# Patient Record
Sex: Male | Born: 1960
Health system: Southern US, Community
[De-identification: ages and names within clinical notes are randomized; demographics above are authoritative.]

## PROBLEM LIST (undated history)

## (undated) DIAGNOSIS — J189 Pneumonia, unspecified organism: Secondary | ICD-10-CM

## (undated) DIAGNOSIS — I10 Essential (primary) hypertension: Secondary | ICD-10-CM

## (undated) DIAGNOSIS — I739 Peripheral vascular disease, unspecified: Secondary | ICD-10-CM

## (undated) DIAGNOSIS — I2510 Atherosclerotic heart disease of native coronary artery without angina pectoris: Secondary | ICD-10-CM

## (undated) DIAGNOSIS — E785 Hyperlipidemia, unspecified: Secondary | ICD-10-CM

## (undated) HISTORY — PX: TONSILLECTOMY: SUR1361

## (undated) HISTORY — DX: Hyperlipidemia, unspecified: E78.5

## (undated) HISTORY — DX: Peripheral vascular disease, unspecified: I73.9

## (undated) HISTORY — DX: Essential (primary) hypertension: I10

## (undated) HISTORY — DX: Atherosclerotic heart disease of native coronary artery without angina pectoris: I25.10

---

## 2003-12-16 ENCOUNTER — Emergency Department (HOSPITAL_COMMUNITY): Admission: EM | Admit: 2003-12-16 | Discharge: 2003-12-16 | Payer: Self-pay | Admitting: Emergency Medicine

## 2004-02-10 ENCOUNTER — Ambulatory Visit (HOSPITAL_COMMUNITY): Admission: RE | Admit: 2004-02-10 | Discharge: 2004-02-11 | Payer: Self-pay | Admitting: Cardiovascular Disease

## 2004-02-10 HISTORY — PX: CARDIAC CATHETERIZATION: SHX172

## 2004-06-09 HISTORY — PX: CARDIAC CATHETERIZATION: SHX172

## 2004-06-11 ENCOUNTER — Emergency Department (HOSPITAL_COMMUNITY): Admission: EM | Admit: 2004-06-11 | Discharge: 2004-06-11 | Payer: Self-pay | Admitting: Family Medicine

## 2005-11-29 DIAGNOSIS — I739 Peripheral vascular disease, unspecified: Secondary | ICD-10-CM

## 2005-11-29 HISTORY — DX: Peripheral vascular disease, unspecified: I73.9

## 2005-12-28 ENCOUNTER — Emergency Department (HOSPITAL_COMMUNITY): Admission: EM | Admit: 2005-12-28 | Discharge: 2005-12-28 | Payer: Self-pay | Admitting: Family Medicine

## 2008-09-04 ENCOUNTER — Emergency Department (HOSPITAL_COMMUNITY): Admission: EM | Admit: 2008-09-04 | Discharge: 2008-09-04 | Payer: Self-pay | Admitting: Emergency Medicine

## 2008-10-16 ENCOUNTER — Emergency Department (HOSPITAL_COMMUNITY): Admission: EM | Admit: 2008-10-16 | Discharge: 2008-10-16 | Payer: Self-pay | Admitting: Emergency Medicine

## 2010-01-08 DIAGNOSIS — I2510 Atherosclerotic heart disease of native coronary artery without angina pectoris: Secondary | ICD-10-CM

## 2010-01-08 HISTORY — DX: Atherosclerotic heart disease of native coronary artery without angina pectoris: I25.10

## 2010-03-28 ENCOUNTER — Encounter: Payer: Self-pay | Admitting: Cardiovascular Disease

## 2010-07-23 NOTE — Discharge Summary (Signed)
NAME:  Phillip Mercado, Phillip Mercado NO.:  [id number]   MEDICAL RECORD NO.:  [id number]          PATIENT TYPE:  OIB   LOCATION:  6533                         FACILITY:  [facility]   PHYSICIAN:  Nanetta Batty, M.D.   DATE OF BIRTH:  1961-02-08   DATE OF ADMISSION:  02/10/2004  DATE OF DISCHARGE:  02/11/2004                                 DISCHARGE SUMMARY   DISCHARGE DIAGNOSES:  1.  Coronary disease, status post elective left anterior descending CYPHER      stenting this admission.  2.  Dyslipidemia.  3.  Family history of coronary disease.   HOSPITAL COURSE:  Mr. Naill is a 50 year old male who was referred to Dr.  Allyson Sabal for stress test by Dr. Barbee Shropshire.  He has a family history of coronary  disease.  He had been having some fatigue and exertional chest discomfort.  He had a Cardiolite study done December 26, 2003, that was abnormal with some  anterior wall thinning.  He was seen by Dr. Allyson Sabal and set up for diagnostic  catheterization which was done at the heart center on February 04, 2004.  This revealed a 90% mid LAD lesion.  The RCA had a 30% narrowing and the  circumflex was normal.  His EF was normal.  He was set up for elective LAD  intervention.  He was put on aspirin and Plavix.  He had been on Advicor  already from Dr. Loney Laurence office.  On February 10, 2004, he was admitted  and underwent elective LAD CYPHER stenting with good result.  He did have  some transient hypotension, bradycardia during the procedure which was  probably vagal.  This recurred when his sheath was pulled but responded to  IV fluids.  The morning of February 11, 2004, he is up without problems.  We  feel he can be discharged.   DISCHARGE MEDICATIONS:  1.  Aspirin 81 mg a day.  2.  Plavix 75 mg a day.  3.  Advicor 500/20 two h.s.  4.  Nitroglycerin sublingual p.r.n.   LABORATORY DATA:  Sodium 139, potassium 4.0, BUN 9, creatinine 1.3.  White  count 7.9, hemoglobin 14.9, hematocrit 43.0,  platelets 177.   EKG preoperatively revealed sinus rhythm without acute changes.   DISPOSITION:  The patient is discharged in stable condition and will follow  up with Dr. Allyson Sabal.  We may want to consider an ACE inhibitor at some point  based on the HOPE data.      LKK/MEDQ  D:  02/11/2004  T:  02/11/2004  Job:  [id number]

## 2010-07-23 NOTE — H&P (Signed)
NAME:  Phillip Mercado, Phillip Mercado NO.:  [id number]   MEDICAL RECORD NO.:  [id number]          PATIENT TYPE:  OIB   LOCATION:  6533                         FACILITY:  [facility]   PHYSICIAN:  Nanetta Batty, M.D.   DATE OF BIRTH:  [birth year]   DATE OF ADMISSION:  02/10/2004  DATE OF DISCHARGE:  02/11/2004                                HISTORY & PHYSICAL   CHIEF COMPLAINT:  Fatigue and chest pain with abnormal Cardiolite study.   HISTORY OF PRESENT ILLNESS:  Phillip Mercado is a 50 year old male who is  referred to Dr. Allyson Sabal by Dr. Barbee Shropshire.  He has a family history of coronary  disease.  He had been having some fatigue and chest pain.  He had a  Cardiolite study done in October 2005 that was abnormal with anterior wall  thinning.  He was seen by Dr. Allyson Sabal in follow-up and set up for a diagnostic  catheterization which was done at the [facility] February 04, 2004.  This  revealed a 90% mid LAD lesion.  He had good LV function.  He was put on  Plavix and aspirin and set up for elective LAD intervention.   PAST MEDICAL HISTORY:  Is unremarkable for hypertension.  He does have  treated hyperlipidemia.  He has had remote left-eye migraine and previous  cerebral angiogram to evaluate this.   CURRENT MEDICATIONS:  1.  Advicor 500/20 two h.s.  2.  Aspirin daily 81 mg.  3.  Plavix 75 mg a day.   ALLERGIES:  No known drug allergies.   SOCIAL HISTORY:  He is married.  He has one child and one grandchild.  Her  works Chief Financial Officer.  He has never smoked, he is very athletic, he plays on  a men's soccer league.   FAMILY HISTORY:  Remarkable for coronary disease.  His mother is alive and  well.  His father had bypass surgery at 61 years old.  He has two brothers  and two sisters in their 57s without significant coronary disease.   REVIEW OF SYSTEMS:  Essentially unremarkable except for noted above. He  denies any GI bleeding or melena.  He has not had renal disease or kidney  stones.   He has not had thyroid problems.  There is no history of diabetes.  Review of systems otherwise unremarkable except for noted above.   PHYSICAL EXAMINATION:  VITAL SIGNS:  Blood pressure 120/80, pulse 70, weight  217, respirations 16.  GENERAL:  He is a well-developed, well-nourished male in no acute distress.  HEENT:  Normocephalic.  Extraocular movements are intact.  Sclerae are  nonicteric.  Lids and conjunctivae are within normal limits.  NECK:  Without bruit and without JVD.  CHEST:  Clear to auscultation and percussion.  CARDIAC:  Reveals regular rate and rhythm without murmur, rub, or gallop.  Normal S1, S2.  ABDOMEN:  Nontender, no hepatosplenomegaly.  EXTREMITIES:  Without edema.  His right groin is mildly ecchymotic after his  recent catheterization but there is no hematoma or bruit.  NEUROLOGIC:  Grossly intact.  He is awake, alert, oriented, and  cooperative.  He moves all extremities without obvious deficit.  SKIN:  Warm and dry.   IMPRESSION:  1.  Exertional fatigue and chest pain consistent with angina with abnormal      Cardiolite study.  2.  Known coronary disease with a 90% left anterior descending coronary      artery lesion at catheterization February 04, 2004.  3.  Dyslipidemia.   PLAN:  The patient will be admitted for LAD stenting February 10, 2004.      LKK/MEDQ  D:  02/11/2004  T:  02/11/2004  Job:  [id number]   cc:   Olene Craven, M.D.  [address]  [address]  [place]  [place] [address]  Fax: [phone]

## 2010-07-23 NOTE — Cardiovascular Report (Signed)
NAME:  Phillip Mercado, Phillip Mercado NO.:  [id number]   MEDICAL RECORD NO.:  [id number]          PATIENT TYPE:  OIB   LOCATION:  2899                         FACILITY:  [facility]   PHYSICIAN:  Nanetta Batty, M.D.   DATE OF BIRTH:  03-25-1960   DATE OF PROCEDURE:  02/10/2004  DATE OF DISCHARGE:                              CARDIAC CATHETERIZATION   PROCEDURE:  Cardiac catheterization and stent placement.   CARDIOLOGIST:  Nanetta Batty, M.D.   INDICATIONS FOR PROCEDURE:  The patient is a 50 year old married white male  with a history of dyslipidemia, chest pain, a positive Cardiolite and a  cardiac catheterization documenting a mid-LAD lesion of 90% after the second  diagonal branch, performed on February 04, 2004.  The patient was started on  aspirin and Plavix at that time.  He presents now for a percutaneous  intervention.   DESCRIPTION OF PROCEDURE:  The patient is brought to the second floor [facility] in the post-  absorptive state.  He was premedicated with p.o. Valium.  His right groin  was prepped and shaved in the usual sterile fashion.  Xylocaine 1% was used  for local anesthesia.  A 6-French sheath was inserted into the right femoral  artery using the standard Seldinger technique.  A 6-French sheath was  inserted into the right femoral vein.  The patient was on aspirin and Plavix  and received an additional 150 mg of p.o. Plavix as well as an Angiomax  bolus.  The ACT is approximately 300.  Visipaque dye was used for the  entirety of the case.  The retrograde aortic pressures were monitored  throughout the case.   HEMODYNAMICS:  Aortic systolic pressure:  104.  Diastolic pressure:  68.   NOTATION:   DESCRIPTION OF PROCEDURE:  It should be noted that during sheath insertion,  the patient became vagal and dropped his blood pressure and heart rate,  which responded well to fluid resuscitation and 0.5 mg of IV  atropine.  Using a 6-French JL3.5 guide catheter and a OM4 190 S' PORT guide wire with  a 2.5 x 10 cutting balloon, an atherectomy was performed on the mid-LAD  lesion, and 200 mcg of intercoronary nitroglycerin were administered.  Following this a 30.13 CYPHER stent was then deployed at 14 atmospheres,  resulting in a reduction of a 90% lesion to 0% residual.  The patient  tolerated the procedure well without hemodynamic or angiographic sequela.   OVERALL IMPRESSION:  A successful mid-left anterior descending coronary  artery percutaneous coronary intervention and stenting using a cutting  balloon atherectomy and CYPHER drug-eluting stent and Angiomax  anticoagulation.   DISPOSITION:  The time to remove the sheath will be in two hours.  The  patient will be discharged home in the morning, after being hydrated, and  will be on aspirin and Plavix.  I will see him back in the office in  approximately one to two weeks in followup.  Dr. Demetrios Isaacs Hertweck's office  was notified of these results.  The patient left the laboratory in stable condition.  JB/MEDQ  D:  02/10/2004  T:  02/10/2004  Job:  [id number]   cc:   Cardiac Cath Lab - [facility]   [facility]  [address]  [place], [place]  [address]   Olene Craven, M.D.  [address]  [address]  [place]  [place] [address]  Fax: [phone]

## 2010-11-16 IMAGING — CR DG ANKLE COMPLETE 3+V*L*
3 series · 3 of 3 positions shown · non-contrast
Comparison: No priors

CLINICAL DATA: Twisted ankle - lateral pain and swelling

LEFT ANKLE COMPLETE - 3+ VIEW

[view not recorded (1 of 3)]
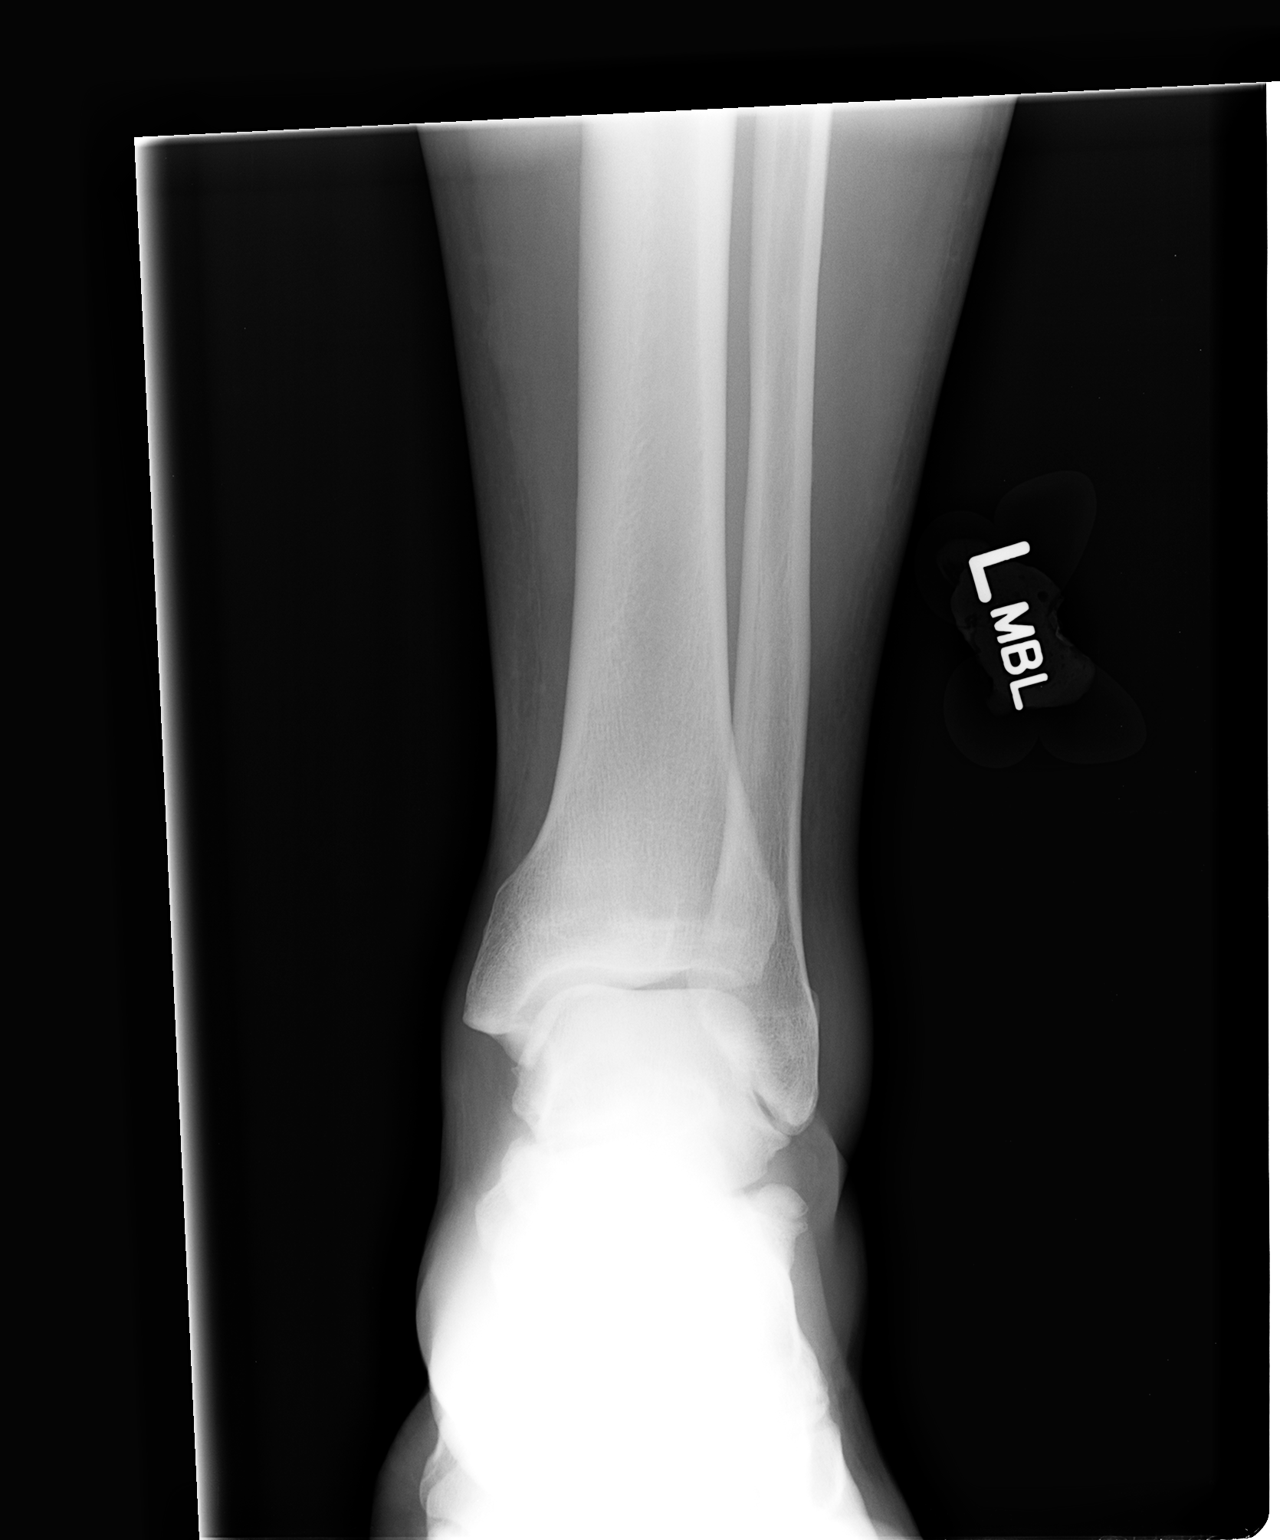

[view not recorded (2 of 3)]
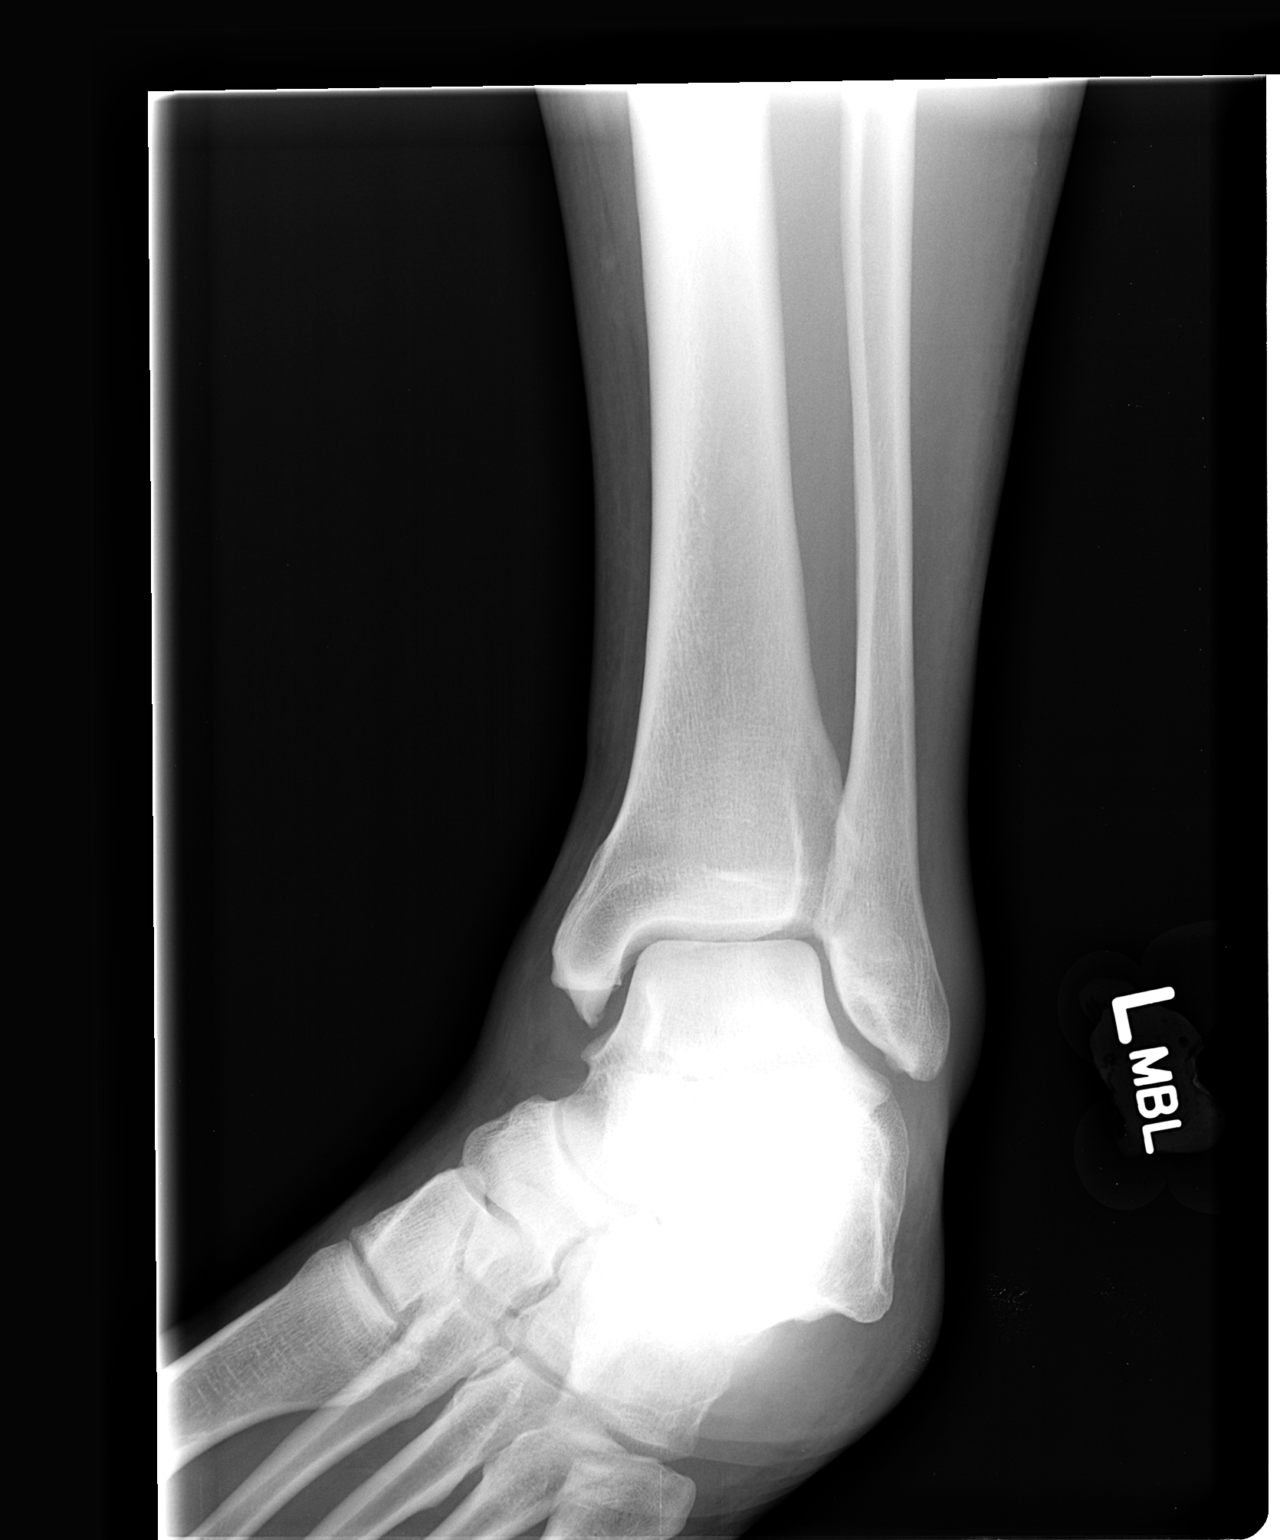

[view not recorded (3 of 3)]
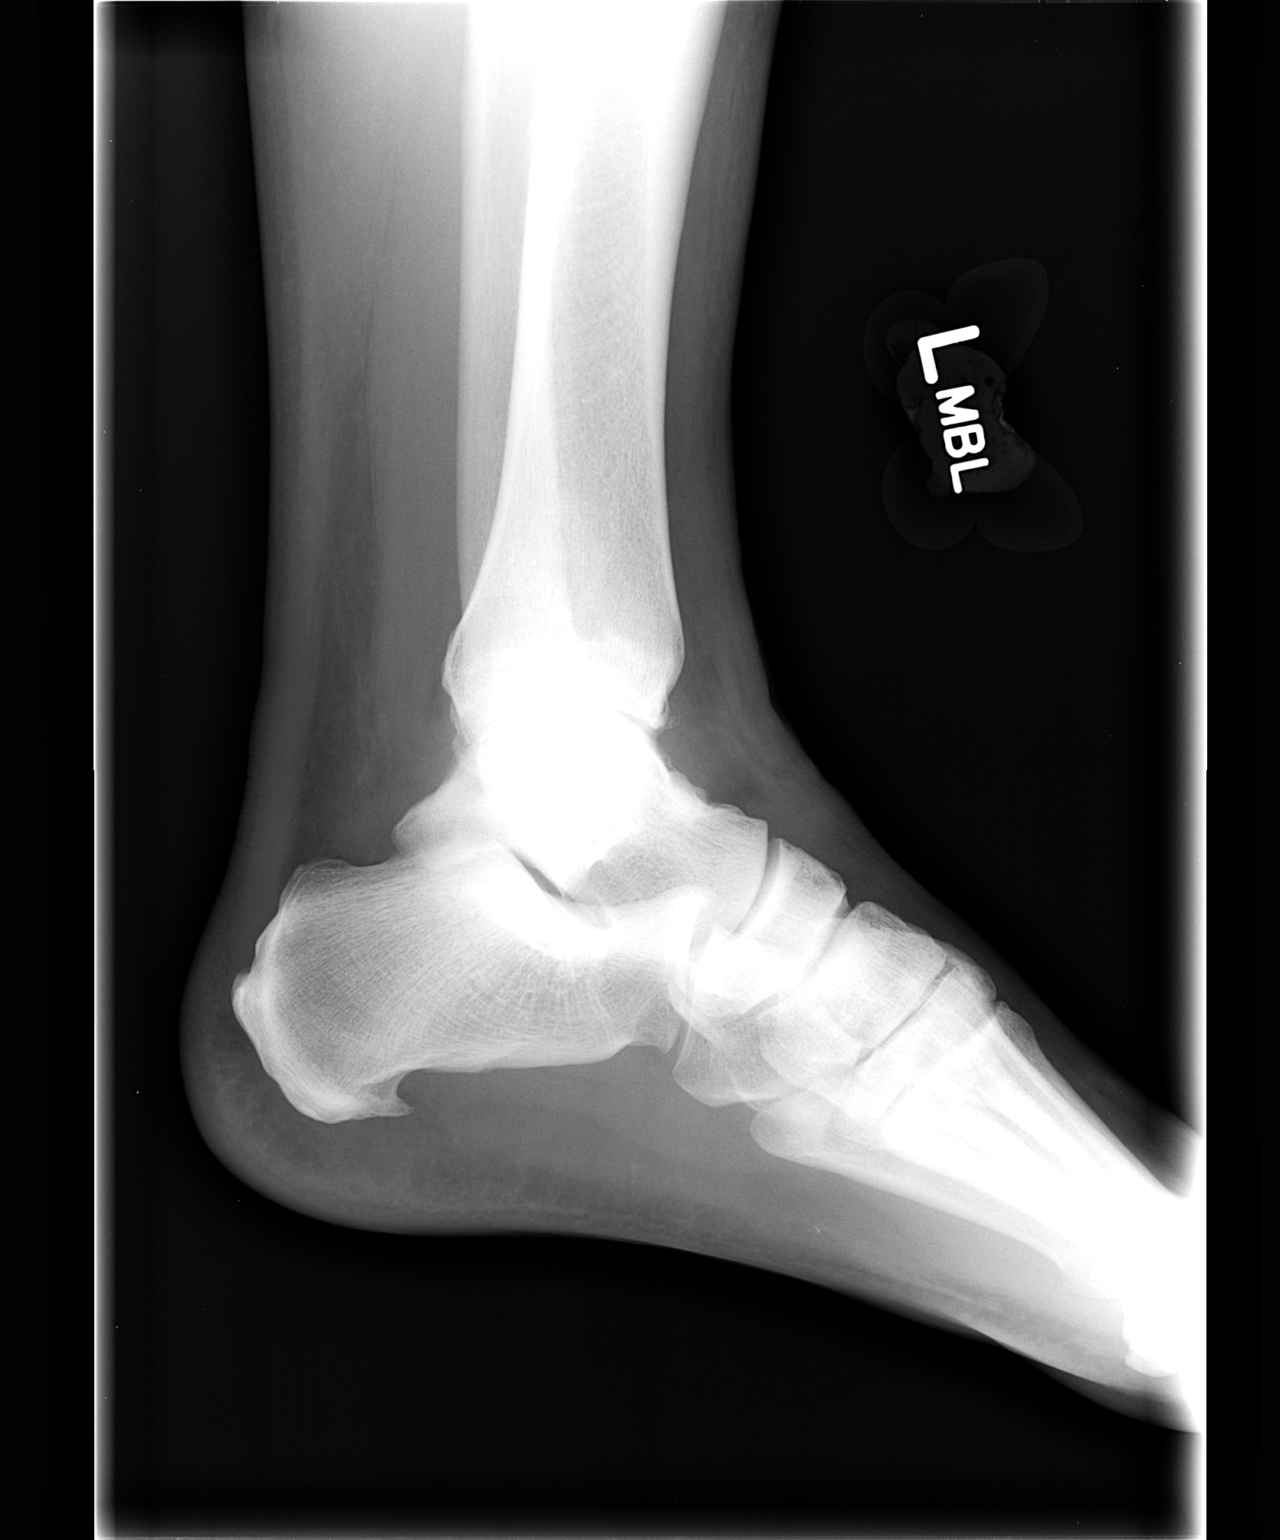

[3 of 3 positions shown; findings below may reference images not displayed]

FINDINGS: There is soft tissue swelling over the lateral malleolus
but no definite fracture or other acute abnormality.  There is a
prominent calcaneal spur noted.
IMPRESSION: Lateral soft tissue swelling - otherwise unremarkable.

## 2012-06-12 ENCOUNTER — Encounter: Payer: Self-pay | Admitting: Pharmacist Clinician (PhC)/ Clinical Pharmacy Specialist

## 2012-06-14 ENCOUNTER — Encounter: Payer: Self-pay | Admitting: Cardiovascular Disease

## 2013-01-12 ENCOUNTER — Other Ambulatory Visit: Payer: Self-pay | Admitting: Cardiovascular Disease

## 2013-01-14 NOTE — Telephone Encounter (Signed)
Rx was sent to pharmacy electronically. 

## 2013-04-16 ENCOUNTER — Other Ambulatory Visit: Payer: Self-pay | Admitting: Cardiovascular Disease

## 2013-04-18 NOTE — Telephone Encounter (Signed)
Still have not received the okay for his Simvastatin.

## 2013-04-18 NOTE — Telephone Encounter (Signed)
Rx was sent to pharmacy electronically. 

## 2013-08-19 ENCOUNTER — Other Ambulatory Visit: Payer: Self-pay | Admitting: *Deleted

## 2013-08-19 MED ORDER — CLOPIDOGREL BISULFATE 75 MG PO TABS: ORAL_TABLET | ORAL | Status: DC

## 2013-08-19 NOTE — Telephone Encounter (Signed)
Rx was sent to pharmacy electronically. 

## 2013-08-21 ENCOUNTER — Other Ambulatory Visit: Payer: Self-pay | Admitting: *Deleted

## 2013-08-22 ENCOUNTER — Other Ambulatory Visit: Payer: Self-pay

## 2013-08-22 MED ORDER — CLOPIDOGREL BISULFATE 75 MG PO TABS: ORAL_TABLET | ORAL | Status: DC

## 2013-08-22 NOTE — Telephone Encounter (Signed)
Rx was sent to pharmacy electronically. 

## 2013-09-19 ENCOUNTER — Telehealth: Payer: Self-pay | Admitting: Cardiovascular Disease

## 2013-09-19 DIAGNOSIS — E785 Hyperlipidemia, unspecified: Secondary | ICD-10-CM

## 2013-09-19 NOTE — Telephone Encounter (Signed)
Returned call to patient fasting lab orders mailed to patient.

## 2013-09-19 NOTE — Telephone Encounter (Signed)
Please mail another lab slip to patient so he can have his blood work done prior to his September appointment with Dr. Allyson SabalBerry.

## 2013-10-21 ENCOUNTER — Other Ambulatory Visit: Payer: Self-pay | Admitting: Cardiovascular Disease

## 2013-10-22 NOTE — Telephone Encounter (Signed)
Pt is going out of town tonight and he still have not received his generic Plavix ans Simvastatin. Please call it in today asap please  Call to [facility] on [address].

## 2013-11-04 LAB — BASIC METABOLIC PANEL
BUN: 19 mg/dL (ref 6–23)
CO2: 29 mEq/L (ref 19–32)
Calcium: 9.6 mg/dL (ref 8.4–10.5)
Chloride: 106 mEq/L (ref 96–112)
Creat: 1.36 mg/dL — ABNORMAL HIGH (ref 0.50–1.35)
Glucose, Bld: 99 mg/dL (ref 70–99)
Potassium: 5 mEq/L (ref 3.5–5.3)
Sodium: 140 mEq/L (ref 135–145)

## 2013-11-04 LAB — HEPATIC FUNCTION PANEL
ALT: 28 U/L (ref 0–53)
AST: 26 U/L (ref 0–37)
Albumin: 4.6 g/dL (ref 3.5–5.2)
Alkaline Phosphatase: 97 U/L (ref 39–117)
Bilirubin, Direct: 0.1 mg/dL (ref 0.0–0.3)
Indirect Bilirubin: 0.4 mg/dL (ref 0.2–1.2)
Total Bilirubin: 0.5 mg/dL (ref 0.2–1.2)
Total Protein: 7.2 g/dL (ref 6.0–8.3)

## 2013-11-04 LAB — LIPID PANEL
Cholesterol: 171 mg/dL (ref 0–200)
HDL: 41 mg/dL (ref >39)
LDL Cholesterol: 89 mg/dL (ref 0–99)
Total CHOL/HDL Ratio: 4.2 Ratio
Triglycerides: 203 mg/dL — ABNORMAL HIGH (ref <150)
VLDL: 41 mg/dL — ABNORMAL HIGH (ref 0–40)

## 2013-11-05 ENCOUNTER — Encounter: Payer: Self-pay | Admitting: *Deleted

## 2013-11-08 ENCOUNTER — Encounter: Payer: Self-pay | Admitting: Cardiovascular Disease

## 2013-11-08 ENCOUNTER — Ambulatory Visit (INDEPENDENT_AMBULATORY_CARE_PROVIDER_SITE_OTHER): Payer: 59 | Admitting: Cardiovascular Disease

## 2013-11-08 VITALS — BP 138/88 | HR 64 | Ht 72.0 in | Wt 231.0 lb

## 2013-11-08 DIAGNOSIS — I2510 Atherosclerotic heart disease of native coronary artery without angina pectoris: Secondary | ICD-10-CM | POA: Insufficient documentation

## 2013-11-08 DIAGNOSIS — E785 Hyperlipidemia, unspecified: Secondary | ICD-10-CM

## 2013-11-08 DIAGNOSIS — I10 Essential (primary) hypertension: Secondary | ICD-10-CM

## 2013-11-08 NOTE — Assessment & Plan Note (Signed)
Controlled on current medications 

## 2013-11-08 NOTE — Patient Instructions (Signed)
Your physician wants you to follow-up in: 1 year with Dr Berry. You will receive a reminder letter in the mail two months in advance. If you don't receive a letter, please call our office to schedule the follow-up appointment.  

## 2013-11-08 NOTE — Assessment & Plan Note (Signed)
History of CAD status post LAD PCI and stenting using a Cypher drug-eluting stent by myself December 2005. He at 30%, RCA stenosis with normal LV function. His left Myoview stress test performed 01/08/10 was nonischemic. He denies chest pain or shortness of breath.

## 2013-11-08 NOTE — Assessment & Plan Note (Signed)
On statin therapy. Recent lipid profile performed 11/04/13 revealed a total cholesterol of 171, LDL of 89 and HDL of 41

## 2013-11-08 NOTE — Progress Notes (Signed)
11/08/2013 Vista Deck   01/21/1961  [id number]  Primary Physician No PCP Per Patient Primary Cardiologist: Runell Gess MD Roseanne Reno   HPI:  Phillip Mercado is a 53 year old mild to moderately overweight married Caucasian male father of 5 children, 2 daughters and grandchildren who I last saw in the office 07/09/12. He has a history of CAD status post LAD PCI and stenting with a Cypher drug-eluting stent of December 2005. He had 30% dominant RCA stenosis and normal in function. His other problems include hypertension and hyperlipidemia. He denies chest pain or shortness of breath. His last Myoview stress test performed 01/08/10 was nonischemic. Recent lipid profile performed 11/04/13 revealed a total cholesterol 171, LDL of 89 and HDL of 41.   Current Outpatient Prescriptions  Medication Sig Dispense Refill  . aspirin EC 81 MG tablet Take 81 mg by mouth daily.      . clopidogrel (PLAVIX) 75 MG tablet Take 1 tablet by mouth once daily. <please make appointment for future refills>  30 tablet  1  . Multiple Vitamin (MULTIVITAMIN) tablet Take 1 tablet by mouth daily.      . simvastatin (ZOCOR) 80 MG tablet TAKE ONE TABLET BY MOUTH NIGHTLY AT BEDTIME   30 tablet  0  . vitamin C (ASCORBIC ACID) 500 MG tablet Take 500 mg by mouth daily.       No current facility-administered medications for this visit.    Allergies  Allergen Reactions  . Vytorin [Ezetimibe-Simvastatin] Other (See Comments)    Increased liver enzymes    History   Social History  . Marital Status: Single    Spouse Name: N/A    Number of Children: N/A  . Years of Education: N/A   Occupational History  . Not on file.   Social History Main Topics  . Smoking status: Never Smoker   . Smokeless tobacco: Not on file  . Alcohol Use: Not on file  . Drug Use: Not on file  . Sexual Activity: Not on file   Other Topics Concern  . Not on file   Social History Narrative  . No narrative on file     Review of  Systems: General: negative for chills, fever, night sweats or weight changes.  Cardiovascular: negative for chest pain, dyspnea on exertion, edema, orthopnea, palpitations, paroxysmal nocturnal dyspnea or shortness of breath Dermatological: negative for rash Respiratory: negative for cough or wheezing Urologic: negative for hematuria Abdominal: negative for nausea, vomiting, diarrhea, bright red blood per rectum, melena, or hematemesis Neurologic: negative for visual changes, syncope, or dizziness All other systems reviewed and are otherwise negative except as noted above.    Blood pressure 138/88, pulse 64, height 6' (1.829 m), weight 231 lb (104.781 kg).  General appearance: alert and no distress Neck: no adenopathy, no carotid bruit, no JVD, supple, symmetrical, trachea midline and thyroid not enlarged, symmetric, no tenderness/mass/nodules Lungs: clear to auscultation bilaterally Heart: regular rate and rhythm, S1, S2 normal, no murmur, click, rub or gallop Extremities: extremities normal, atraumatic, no cyanosis or edema  EKG normal sinus rhythm at 71 without ST or T wave changes  ASSESSMENT AND PLAN:   Coronary artery disease History of CAD status post LAD PCI and stenting using a Cypher drug-eluting stent by myself December 2005. He at 30%, RCA stenosis with normal LV function. His left Myoview stress test performed 01/08/10 was nonischemic. He denies chest pain or shortness of breath.  Essential hypertension Controlled on current medications  Hyperlipidemia On statin  therapy. Recent lipid profile performed 11/04/13 revealed a total cholesterol of 171, LDL of 89 and HDL of 41      Runell Gess MD [facility] 11/08/2013 7:59 AM

## 2013-11-26 ENCOUNTER — Other Ambulatory Visit: Payer: Self-pay | Admitting: Cardiovascular Disease

## 2013-11-26 NOTE — Telephone Encounter (Signed)
Rx was sent to pharmacy electronically. 

## 2014-09-29 ENCOUNTER — Other Ambulatory Visit: Payer: Self-pay | Admitting: Cardiovascular Disease

## 2014-12-02 ENCOUNTER — Other Ambulatory Visit: Payer: Self-pay | Admitting: Cardiovascular Disease

## 2014-12-02 MED ORDER — CLOPIDOGREL BISULFATE 75 MG PO TABS: 75.0000 mg | ORAL_TABLET | Freq: Every day | ORAL | Status: DC

## 2014-12-02 MED ORDER — SIMVASTATIN 80 MG PO TABS: 80.0000 mg | ORAL_TABLET | Freq: Every day | ORAL | Status: DC

## 2014-12-02 NOTE — Telephone Encounter (Signed)
Rx(s) sent to pharmacy electronically. LM for patient that meds have been refilled

## 2014-12-02 NOTE — Telephone Encounter (Signed)
°  1. Which medications need to be refilled? Simvastatin and Clopidogrel-please call this in today if possible.  2. Which pharmacy is medication to be sent to?[phone]  3. Do they need a 30 day or 90 day supply? 30 and refills  4. Would they like a call back once the medication has been sent to the pharmacy? yes

## 2015-01-23 ENCOUNTER — Ambulatory Visit: Payer: Self-pay | Admitting: Cardiovascular Disease

## 2015-02-16 ENCOUNTER — Telehealth: Payer: Self-pay | Admitting: Cardiovascular Disease

## 2015-02-16 DIAGNOSIS — Z79899 Other long term (current) drug therapy: Secondary | ICD-10-CM

## 2015-02-16 DIAGNOSIS — E785 Hyperlipidemia, unspecified: Secondary | ICD-10-CM

## 2015-02-16 LAB — BASIC METABOLIC PANEL
BUN: 16 mg/dL (ref 7–25)
CO2: 27 mmol/L (ref 20–31)
Calcium: 9.6 mg/dL (ref 8.6–10.3)
Chloride: 105 mmol/L (ref 98–110)
Creat: 1.14 mg/dL (ref 0.70–1.33)
Glucose, Bld: 89 mg/dL (ref 65–99)
Potassium: 4.5 mmol/L (ref 3.5–5.3)
Sodium: 140 mmol/L (ref 135–146)

## 2015-02-16 LAB — LIPID PANEL
Cholesterol: 169 mg/dL (ref 125–200)
HDL: 35 mg/dL — ABNORMAL LOW (ref >=40)
LDL Cholesterol: 82 mg/dL (ref <130)
Total CHOL/HDL Ratio: 4.8 Ratio (ref <=5.0)
Triglycerides: 262 mg/dL — ABNORMAL HIGH (ref <150)
VLDL: 52 mg/dL — ABNORMAL HIGH (ref <30)

## 2015-02-16 LAB — HEPATIC FUNCTION PANEL
ALT: 24 U/L (ref 9–46)
AST: 24 U/L (ref 10–35)
Albumin: 4.1 g/dL (ref 3.6–5.1)
Alkaline Phosphatase: 91 U/L (ref 40–115)
Bilirubin, Direct: 0.1 mg/dL (ref <=0.2)
Indirect Bilirubin: 0.4 mg/dL (ref 0.2–1.2)
Total Bilirubin: 0.5 mg/dL (ref 0.2–1.2)
Total Protein: 7.1 g/dL (ref 6.1–8.1)

## 2015-02-16 NOTE — Telephone Encounter (Signed)
Repeat of last year's labs ordered for [facility].

## 2015-02-16 NOTE — Telephone Encounter (Signed)
Would you please send lab order down stairs asao,pt is on his way now.

## 2015-02-18 ENCOUNTER — Encounter: Payer: Self-pay | Admitting: Cardiovascular Disease

## 2015-02-18 ENCOUNTER — Ambulatory Visit (INDEPENDENT_AMBULATORY_CARE_PROVIDER_SITE_OTHER): Payer: 59 | Admitting: Cardiovascular Disease

## 2015-02-18 VITALS — BP 122/84 | HR 81 | Ht 72.0 in | Wt 232.3 lb

## 2015-02-18 DIAGNOSIS — I10 Essential (primary) hypertension: Secondary | ICD-10-CM | POA: Diagnosis not present

## 2015-02-18 DIAGNOSIS — E785 Hyperlipidemia, unspecified: Secondary | ICD-10-CM

## 2015-02-18 DIAGNOSIS — I2583 Coronary atherosclerosis due to lipid rich plaque: Secondary | ICD-10-CM

## 2015-02-18 DIAGNOSIS — I2510 Atherosclerotic heart disease of native coronary artery without angina pectoris: Secondary | ICD-10-CM | POA: Diagnosis not present

## 2015-02-18 NOTE — Assessment & Plan Note (Signed)
History of hyperlipidemia on simvastatin with recent lipid profile performed 02/16/15 revealing total cholesterol 169, LDL 82 HDL of 35

## 2015-02-18 NOTE — Assessment & Plan Note (Signed)
History of hypertension blood pressure measured at 122/84. He is on no blood pressure medicines.

## 2015-02-18 NOTE — Patient Instructions (Signed)
Your physician wants you to follow-up in: 1 Year. You will receive a reminder letter in the mail two months in advance. If you don't receive a letter, please call our office to schedule the follow-up appointment.  

## 2015-02-18 NOTE — Progress Notes (Signed)
02/18/2015 Vista Deck   1960/04/27  [id number]  Primary Physician No PCP Per Patient Primary Cardiologist: Runell Gess MD Roseanne Reno   HPI:  Phillip Mercado is a 54 year old mild to moderately overweight married Caucasian male father of 5 children, 2 daughters and grandchildren who I last saw in the office 11/08/13. He has a history of CAD status post LAD PCI and stenting with a Cypher drug-eluting stent of December 2005. He had 30% dominant RCA stenosis and normal in function. His other problems include hypertension and hyperlipidemia. He denies chest pain or shortness of breath. His last Myoview stress test performed 01/08/10 was nonischemic. Recent lipid profile performed 02/16/15 revealed a total cholesterol 169, LDL 82 HDL of 35   Current Outpatient Prescriptions  Medication Sig Dispense Refill  . aspirin EC 81 MG tablet Take 81 mg by mouth daily.    . clopidogrel (PLAVIX) 75 MG tablet Take 1 tablet (75 mg total) by mouth daily. 30 tablet 1  . Multiple Vitamin (MULTIVITAMIN) tablet Take 1 tablet by mouth daily.    . simvastatin (ZOCOR) 80 MG tablet Take 1 tablet (80 mg total) by mouth at bedtime. 30 tablet 1  . vitamin C (ASCORBIC ACID) 500 MG tablet Take 500 mg by mouth daily.     No current facility-administered medications for this visit.    Allergies  Allergen Reactions  . Vytorin [Ezetimibe-Simvastatin] Other (See Comments)    Increased liver enzymes    Social History   Social History  . Marital Status: Single    Spouse Name: N/A  . Number of Children: N/A  . Years of Education: N/A   Occupational History  . Not on file.   Social History Main Topics  . Smoking status: Never Smoker   . Smokeless tobacco: Not on file  . Alcohol Use: Not on file  . Drug Use: Not on file  . Sexual Activity: Not on file   Other Topics Concern  . Not on file   Social History Narrative     Review of Systems: General: negative for chills, fever, night sweats or  weight changes.  Cardiovascular: negative for chest pain, dyspnea on exertion, edema, orthopnea, palpitations, paroxysmal nocturnal dyspnea or shortness of breath Dermatological: negative for rash Respiratory: negative for cough or wheezing Urologic: negative for hematuria Abdominal: negative for nausea, vomiting, diarrhea, bright red blood per rectum, melena, or hematemesis Neurologic: negative for visual changes, syncope, or dizziness All other systems reviewed and are otherwise negative except as noted above.    Blood pressure 122/84, pulse 81, height 6' (1.829 m), weight 232 lb 4.8 oz (105.371 kg).  General appearance: alert and no distress Neck: no adenopathy, no carotid bruit, no JVD, supple, symmetrical, trachea midline and thyroid not enlarged, symmetric, no tenderness/mass/nodules Lungs: clear to auscultation bilaterally Heart: regular rate and rhythm, S1, S2 normal, no murmur, click, rub or gallop Extremities: extremities normal, atraumatic, no cyanosis or edema  EKG normal sinus rhythm 81 without ST or T-wave changes. I personally reviewed this EKG  ASSESSMENT AND PLAN:   Hyperlipidemia History of hyperlipidemia on simvastatin with recent lipid profile performed 02/16/15 revealing total cholesterol 169, LDL 82 HDL of 35  Essential hypertension History of hypertension blood pressure measured at 122/84. He is on no blood pressure medicines.  Coronary artery disease History of coronary artery disease status post PCI stenting of his LAD with a Cypher drug-eluting stent by myself in December 2005. His last stress test performed 01/08/10 was nonischemic. He  denies chest pain or shortness of breath.      Runell GessJonathan J. Whittney Steenson MD FACP,FACC,FAHA, [facility]FSCAI 02/18/2015 4:14 PM

## 2015-02-18 NOTE — Assessment & Plan Note (Signed)
History of coronary artery disease status post PCI stenting of his LAD with a Cypher drug-eluting stent by myself in December 2005. His last stress test performed 01/08/10 was nonischemic. He denies chest pain or shortness of breath.

## 2015-04-27 ENCOUNTER — Other Ambulatory Visit: Payer: Self-pay | Admitting: Cardiovascular Disease

## 2015-05-02 ENCOUNTER — Other Ambulatory Visit: Payer: Self-pay | Admitting: Cardiovascular Disease

## 2015-05-04 ENCOUNTER — Other Ambulatory Visit: Payer: Self-pay | Admitting: *Deleted

## 2015-05-04 MED ORDER — SIMVASTATIN 80 MG PO TABS: 80.0000 mg | ORAL_TABLET | Freq: Every day | ORAL | Status: DC

## 2015-05-04 NOTE — Telephone Encounter (Signed)
REFILL 

## 2015-05-04 NOTE — Telephone Encounter (Signed)
Rx(s) sent to pharmacy electronically.  

## 2015-06-27 ENCOUNTER — Encounter (HOSPITAL_COMMUNITY): Payer: Self-pay | Admitting: *Deleted

## 2015-06-27 ENCOUNTER — Ambulatory Visit (HOSPITAL_COMMUNITY)
Admission: EM | Admit: 2015-06-27 | Discharge: 2015-06-27 | Disposition: A | Discharge status: Home / Self Care | Payer: 59 | Attending: Family Medicine | Admitting: Family Medicine

## 2015-06-27 DIAGNOSIS — Z23 Encounter for immunization: Secondary | ICD-10-CM

## 2015-06-27 DIAGNOSIS — S6991XA Unspecified injury of right wrist, hand and finger(s), initial encounter: Secondary | ICD-10-CM | POA: Diagnosis not present

## 2015-06-27 MED ORDER — POVIDONE-IODINE 10 % EX SOLN
CUTANEOUS | Status: AC
  Filled 2015-06-27: qty 118

## 2015-06-27 MED ORDER — TETANUS-DIPHTH-ACELL PERTUSSIS 5-2.5-18.5 LF-MCG/0.5 IM SUSP
INTRAMUSCULAR | Status: AC
  Filled 2015-06-27: qty 0.5

## 2015-06-27 MED ORDER — TETANUS-DIPHTH-ACELL PERTUSSIS 5-2.5-18.5 LF-MCG/0.5 IM SUSP: 0.5000 mL | Freq: Once | INTRAMUSCULAR | Status: DC

## 2015-06-27 MED ORDER — TETANUS-DIPHTH-ACELL PERTUSSIS 5-2.5-18.5 LF-MCG/0.5 IM SUSP
0.5000 mL | Freq: Once | INTRAMUSCULAR | Status: AC
  Administered 2015-06-27: 0.5 mL via INTRAMUSCULAR

## 2015-06-27 NOTE — ED Notes (Signed)
Reports cutting a "gouge" out of right distal index finger @ 1030 this AM.  Has been applying a tourniquet.  Slow amt oozing noted.  Unk Tdap status.

## 2015-06-27 NOTE — Discharge Instructions (Signed)
Leave bandaged until mon  Then as needed, return if needed.

## 2015-06-27 NOTE — ED Provider Notes (Signed)
CSN: [id number]     Arrival date & time 06/27/15  1523 History   First MD Initiated Contact with Patient 06/27/15 1637     Chief Complaint  Patient presents with  . Extremity Laceration   (Consider location/radiation/quality/duration/timing/severity/associated sxs/prior Treatment) Patient is a 55 y.o. male presenting with skin laceration. The history is provided by the patient.  Laceration Location:  Finger Finger laceration location:  R index finger Depth:  Cutaneous Quality: jagged   Bleeding: controlled   Time since incident:  6 hours Laceration mechanism:  Metal edge (saw blade fell and scraped finger, no subq exposed.) Pain details:    Quality:  Sharp   Severity:  Mild Foreign body present:  No foreign bodies Relieved by:  None tried Worsened by:  Nothing tried Ineffective treatments:  None tried Tetanus status:  Out of date   Past Medical History  Diagnosis Date  . CAD (coronary artery disease) 01/08/2010    R/S MV - EF 58%; normal pattern of perfusion in all regions; no significant wall abnormalities noted; exercise capacity 13 METS, EKG negative for ischemia  . Hyperlipidemia   . Hypertension   . Claudication (HCC) 11/29/2005    bilateral ABIs no evidence of arterial insufficiency; bilateral PVRs, normal values; bilateral lower extremities demonstrate normal values with no suggestion of significant diameter reduction, dissection, aneurysmal dilation or anormality   Past Surgical History  Procedure Laterality Date  . Cardiac catheterization  02/10/2004    mid L anterior descending PCI with 30.13 Cypher stent, resulting in a 90% lesion to a 0% residual  . Cardiac catheterization  06/09/2004    30% stenosis in RCA; widely patent L anterior descending   Family History  Problem Relation Age of Onset  . Stroke Paternal Grandmother   . Sudden death Paternal Grandfather    Social History  Substance Use Topics  . Smoking status: Never Smoker   . Smokeless tobacco: None    . Alcohol Use: No    Review of Systems  Constitutional: Negative.   Skin: Positive for wound.  All other systems reviewed and are negative.   Allergies  Vytorin  Home Medications   Prior to Admission medications   Medication Sig Start Date End Date Taking? Authorizing Provider  aspirin EC 81 MG tablet Take 81 mg by mouth daily.   Yes Historical Provider, MD  clopidogrel (PLAVIX) 75 MG tablet TAKE 1 TABLET BY MOUTH DAILY 05/04/15  Yes Runell GessJonathan J Berry, MD  Multiple Vitamin (MULTIVITAMIN) tablet Take 1 tablet by mouth daily.   Yes Historical Provider, MD  simvastatin (ZOCOR) 80 MG tablet Take 1 tablet (80 mg total) by mouth at bedtime. 05/04/15  Yes Runell GessJonathan J Berry, MD  vitamin C (ASCORBIC ACID) 500 MG tablet Take 500 mg by mouth daily.   Yes Historical Provider, MD   Meds Ordered and Administered this Visit   Medications  Tdap (BOOSTRIX) injection 0.5 mL (not administered)  Tdap (BOOSTRIX) injection 0.5 mL (not administered)    There were no vitals taken for this visit. No data found.   Physical Exam  Constitutional: He is oriented to person, place, and time. He appears well-developed and well-nourished.  Musculoskeletal: Normal range of motion. He exhibits no tenderness.       Hands: Neurological: He is alert and oriented to person, place, and time.  Skin: Skin is warm and dry.  Nursing note and vitals reviewed.   ED Course  Procedures (including critical care time)  Labs Review Labs Reviewed - No  data to display  Imaging Review No results found.   Visual Acuity Review  Right Eye Distance:   Left Eye Distance:   Bilateral Distance:    Right Eye Near:   Left Eye Near:    Bilateral Near:         MDM   1. Finger injury, right, initial encounter    Wound care, tdap given.    Linna Hoff, MD 06/27/15 [phone]

## 2015-07-29 ENCOUNTER — Other Ambulatory Visit: Payer: Self-pay | Admitting: Cardiovascular Disease

## 2016-01-30 ENCOUNTER — Other Ambulatory Visit: Payer: Self-pay | Admitting: Cardiovascular Disease

## 2016-02-01 ENCOUNTER — Other Ambulatory Visit: Payer: Self-pay | Admitting: *Deleted

## 2016-02-01 MED ORDER — CLOPIDOGREL BISULFATE 75 MG PO TABS: 75.0000 mg | ORAL_TABLET | Freq: Every day | ORAL | 0 refills | Status: DC

## 2016-02-26 ENCOUNTER — Other Ambulatory Visit: Payer: Self-pay

## 2016-02-26 ENCOUNTER — Telehealth: Payer: Self-pay | Admitting: Cardiovascular Disease

## 2016-02-26 DIAGNOSIS — Z79899 Other long term (current) drug therapy: Secondary | ICD-10-CM

## 2016-02-26 MED ORDER — CLOPIDOGREL BISULFATE 75 MG PO TABS: 75.0000 mg | ORAL_TABLET | Freq: Every day | ORAL | 1 refills | Status: DC

## 2016-02-26 NOTE — Telephone Encounter (Signed)
S Fasting lipid profile and LFTs.

## 2016-02-26 NOTE — Telephone Encounter (Signed)
New message  Rx  1. clopidogrel 75mg  1 tab daily 2. [facility] 3. 30 day supply

## 2016-02-26 NOTE — Telephone Encounter (Signed)
New message  Pt is wanting to know if he needs bloodwork done before seeing Dr. Allyson SabalBerry on 1/26  If so, please mail order to him

## 2016-02-26 NOTE — Telephone Encounter (Signed)
Labs ordered and mailed  

## 2016-03-29 ENCOUNTER — Other Ambulatory Visit: Payer: Self-pay | Admitting: Cardiovascular Disease

## 2016-03-29 LAB — LIPID PANEL
CHOL/HDL RATIO: 3.2 ratio (ref <5.0)
CHOLESTEROL: 161 mg/dL (ref <200)
HDL: 51 mg/dL (ref >40)
LDL Cholesterol: 88 mg/dL (ref <100)
Triglycerides: 110 mg/dL (ref <150)
VLDL: 22 mg/dL (ref <30)

## 2016-03-29 LAB — HEPATIC FUNCTION PANEL
ALT: 24 U/L (ref 9–46)
AST: 24 U/L (ref 10–35)
Albumin: 4.3 g/dL (ref 3.6–5.1)
Alkaline Phosphatase: 83 U/L (ref 40–115)
BILIRUBIN DIRECT: 0.1 mg/dL (ref <=0.2)
BILIRUBIN INDIRECT: 0.5 mg/dL (ref 0.2–1.2)
BILIRUBIN TOTAL: 0.6 mg/dL (ref 0.2–1.2)
Total Protein: 7.1 g/dL (ref 6.1–8.1)

## 2016-04-01 ENCOUNTER — Encounter: Payer: Self-pay | Admitting: Cardiovascular Disease

## 2016-04-01 ENCOUNTER — Ambulatory Visit (INDEPENDENT_AMBULATORY_CARE_PROVIDER_SITE_OTHER): Payer: 59 | Admitting: Cardiovascular Disease

## 2016-04-01 VITALS — BP 122/80 | HR 75 | Ht 72.0 in | Wt 223.0 lb

## 2016-04-01 DIAGNOSIS — I10 Essential (primary) hypertension: Secondary | ICD-10-CM | POA: Diagnosis not present

## 2016-04-01 DIAGNOSIS — I2510 Atherosclerotic heart disease of native coronary artery without angina pectoris: Secondary | ICD-10-CM

## 2016-04-01 DIAGNOSIS — E7800 Pure hypercholesterolemia, unspecified: Secondary | ICD-10-CM | POA: Diagnosis not present

## 2016-04-01 MED ORDER — CLOPIDOGREL BISULFATE 75 MG PO TABS: 75.0000 mg | ORAL_TABLET | Freq: Every day | ORAL | 3 refills | Status: DC

## 2016-04-01 MED ORDER — SIMVASTATIN 80 MG PO TABS: 80.0000 mg | ORAL_TABLET | Freq: Every day | ORAL | 3 refills | Status: DC

## 2016-04-01 NOTE — Patient Instructions (Signed)

## 2016-04-01 NOTE — Progress Notes (Signed)
04/01/2016 Phillip Mercado   [birth year]  [id number]  Primary Physician No PCP Per Patient Primary Cardiologist: Runell Gess MD Roseanne Reno  HPI:  Phillip Mercado is a 56 year old mild to moderately overweight married Caucasian male father of 5 children, 2 daughters and grandchildren who I last saw in the office 02/18/15. He has a history of CAD status post LAD PCI and stenting with a Cypher drug-eluting stent of December 2005. He had 30% dominant RCA stenosis and normal in function. His other problems include hypertension and hyperlipidemia. He denies chest pain or shortness of breath. His last Myoview stress test performed 01/08/10 was nonischemic. Recent lipid profile performed 03/29/16 revealed a total cholesterol 161, LDL of 88 and HDL of 51.   Current Outpatient Prescriptions  Medication Sig Dispense Refill  . aspirin EC 81 MG tablet Take 81 mg by mouth daily.    . clopidogrel (PLAVIX) 75 MG tablet Take 1 tablet (75 mg total) by mouth daily. 90 tablet 3  . Multiple Vitamin (MULTIVITAMIN) tablet Take 1 tablet by mouth daily.    . simvastatin (ZOCOR) 80 MG tablet Take 1 tablet (80 mg total) by mouth at bedtime. 90 tablet 3  . vitamin C (ASCORBIC ACID) 500 MG tablet Take 500 mg by mouth daily.     No current facility-administered medications for this visit.     Allergies  Allergen Reactions  . Vytorin [Ezetimibe-Simvastatin] Other (See Comments)    Increased liver enzymes    Social History   Social History  . Marital status: Married    Spouse name: N/A  . Number of children: N/A  . Years of education: N/A   Occupational History  . Not on file.   Social History Main Topics  . Smoking status: Never Smoker  . Smokeless tobacco: Never Used  . Alcohol use No  . Drug use: No  . Sexual activity: Not on file   Other Topics Concern  . Not on file   Social History Narrative  . No narrative on file     Review of Systems: General: negative for chills, fever, night  sweats or weight changes.  Cardiovascular: negative for chest pain, dyspnea on exertion, edema, orthopnea, palpitations, paroxysmal nocturnal dyspnea or shortness of breath Dermatological: negative for rash Respiratory: negative for cough or wheezing Urologic: negative for hematuria Abdominal: negative for nausea, vomiting, diarrhea, bright red blood per rectum, melena, or hematemesis Neurologic: negative for visual changes, syncope, or dizziness All other systems reviewed and are otherwise negative except as noted above.    Blood pressure 122/80, pulse 75, height 6' (1.829 m), weight 223 lb (101.2 kg).  General appearance: alert and no distress Neck: no adenopathy, no carotid bruit, no JVD, supple, symmetrical, trachea midline and thyroid not enlarged, symmetric, no tenderness/mass/nodules Lungs: clear to auscultation bilaterally Heart: regular rate and rhythm, S1, S2 normal, no murmur, click, rub or gallop Extremities: extremities normal, atraumatic, no cyanosis or edema  EKG sinus rhythm at 75 without ST or T-wave changes. I personally reviewed this EKG  ASSESSMENT AND PLAN:   Coronary artery disease History of CAD status post LAD PCI and drug-eluting stenting with a Cypher stent by myself December 2005. He had a 30% dominant RCA stenosis and normal LV function. His last Myoview stress test performed 01/08/10 with nonischemic. He denies chest pain or shortness of breath.  Essential hypertension History of hypertension and blood pressure measures 122/80. He is not on antihypertensive medications.  Hyperlipidemia History of hyperlipidemia on  statin therapy of recent lipid profile performed 03/29/16 revealing a total cholesterol 161, LDL 88 and HDL of 51.      Runell GessJonathan J. Oscar Hank MD FACP,FACC,FAHA, [facility]FSCAI 04/01/2016 3:06 PM

## 2016-04-01 NOTE — Assessment & Plan Note (Signed)
History of hypertension and blood pressure measures 122/80. He is not on antihypertensive medications.

## 2016-04-01 NOTE — Assessment & Plan Note (Signed)
History of CAD status post LAD PCI and drug-eluting stenting with a Cypher stent by myself December 2005. He had a 30% dominant RCA stenosis and normal LV function. His last Myoview stress test performed 01/08/10 with nonischemic. He denies chest pain or shortness of breath.

## 2016-04-01 NOTE — Assessment & Plan Note (Signed)
History of hyperlipidemia on statin therapy of recent lipid profile performed 03/29/16 revealing a total cholesterol 161, LDL 88 and HDL of 51.

## 2016-04-07 ENCOUNTER — Telehealth: Payer: Self-pay | Admitting: Cardiovascular Disease

## 2016-04-07 NOTE — Telephone Encounter (Signed)
LMTCB for results. 

## 2016-04-07 NOTE — Telephone Encounter (Signed)
-----   Message from Runell GessJonathan J Berry, MD sent at 03/31/2016  7:29 AM EST ----- Excellent FLP and LFTs

## 2016-06-05 ENCOUNTER — Ambulatory Visit (HOSPITAL_COMMUNITY)
Admission: EM | Admit: 2016-06-05 | Discharge: 2016-06-05 | Disposition: A | Discharge status: Home / Self Care | Payer: 59 | Attending: Family Medicine | Admitting: Family Medicine

## 2016-06-05 ENCOUNTER — Encounter (HOSPITAL_COMMUNITY): Payer: Self-pay | Admitting: Emergency Medicine

## 2016-06-05 DIAGNOSIS — B9789 Other viral agents as the cause of diseases classified elsewhere: Secondary | ICD-10-CM | POA: Diagnosis not present

## 2016-06-05 DIAGNOSIS — J069 Acute upper respiratory infection, unspecified: Secondary | ICD-10-CM | POA: Diagnosis not present

## 2016-06-05 NOTE — ED Triage Notes (Signed)
The patient presented to the [facility] with a complaint of a cough with congestion x 4 days.

## 2016-06-05 NOTE — Discharge Instructions (Signed)
You most likely have a viral URI, I advise rest, plenty of fluids and management of symptoms with over the counter medicines. For symptoms you may take Tylenol as needed every 4-6 hours for body aches or fever, not to exceed 4,000 mg a day, Take mucinex or mucinex DM ever 12 hours with a full glass of water, you may use an inhaled steroid such as Flonase, 2 sprays each nostril once a day for congestion, or an antihistamine such as Claritin or Zyrtec once a day. Should your symptoms worsen or fail to resolve, follow up with your primary care provider or return to clinic.  °

## 2016-06-05 NOTE — ED Provider Notes (Signed)
CSN: [id number]     Arrival date & time 06/05/16  1201 History   First MD Initiated Contact with Patient 06/05/16 1245     Chief Complaint  Patient presents with  . Cough   (Consider location/radiation/quality/duration/timing/severity/associated sxs/prior Treatment) 56 year old male presents with a 4 day history of cough. Does not smoke, no history of known lung disease.   The history is provided by the patient.  Cough  Cough characteristics:  Non-productive, dry and hacking Sputum characteristics:  Clear and yellow Severity:  Mild Onset quality:  Gradual Duration:  4 days Timing:  Sporadic Progression:  Waxing and waning Chronicity:  New Smoker: no   Context: upper respiratory infection   Context: not exposure to allergens, not fumes, not occupational exposure, not sick contacts, not smoke exposure and not with activity   Relieved by:  Nothing Worsened by:  Nothing Ineffective treatments:  Decongestant, cough suppressants and rest Associated symptoms: chills and sinus congestion   Associated symptoms: no chest pain, no diaphoresis, no ear pain, no eye discharge, no fever, no headaches, no myalgias, no rhinorrhea, no shortness of breath, no sore throat and no wheezing     Past Medical History:  Diagnosis Date  . CAD (coronary artery disease) 01/08/2010   R/S MV - EF 58%; normal pattern of perfusion in all regions; no significant wall abnormalities noted; exercise capacity 13 METS, EKG negative for ischemia  . Claudication (HCC) 11/29/2005   bilateral ABIs no evidence of arterial insufficiency; bilateral PVRs, normal values; bilateral lower extremities demonstrate normal values with no suggestion of significant diameter reduction, dissection, aneurysmal dilation or anormality  . Hyperlipidemia   . Hypertension    Past Surgical History:  Procedure Laterality Date  . CARDIAC CATHETERIZATION  02/10/2004   mid L anterior descending PCI with 30.13 Cypher stent, resulting in a 90%  lesion to a 0% residual  . CARDIAC CATHETERIZATION  06/09/2004   30% stenosis in RCA; widely patent L anterior descending   Family History  Problem Relation Age of Onset  . Stroke Paternal Grandmother   . Sudden death Paternal Grandfather    Social History  Substance Use Topics  . Smoking status: Never Smoker  . Smokeless tobacco: Never Used  . Alcohol use No    Review of Systems  Constitutional: Positive for chills. Negative for diaphoresis and fever.  HENT: Positive for congestion. Negative for ear pain, rhinorrhea, sinus pain, sinus pressure and sore throat.   Eyes: Negative for discharge.  Respiratory: Positive for cough. Negative for shortness of breath and wheezing.   Cardiovascular: Negative for chest pain.  Gastrointestinal: Negative for abdominal pain, diarrhea, nausea and vomiting.  Genitourinary: Negative.   Musculoskeletal: Negative for myalgias, neck pain and neck stiffness.  Neurological: Negative for headaches.  All other systems reviewed and are negative.   Allergies  Vytorin [ezetimibe-simvastatin]  Home Medications   Prior to Admission medications   Medication Sig Start Date End Date Taking? Authorizing Provider  aspirin EC 81 MG tablet Take 81 mg by mouth daily.   Yes Historical Provider, MD  clopidogrel (PLAVIX) 75 MG tablet Take 1 tablet (75 mg total) by mouth daily. 04/01/16  Yes Runell Gess, MD  simvastatin (ZOCOR) 80 MG tablet Take 1 tablet (80 mg total) by mouth at bedtime. 04/01/16  Yes Runell Gess, MD   Meds Ordered and Administered this Visit  Medications - No data to display  BP 133/75 (BP Location: Right Arm)   Pulse 84   Temp 98.9 F (  37.2 C) (Oral)   Resp 18   SpO2 98%  No data found.   Physical Exam  Constitutional: He is oriented to person, place, and time. He appears well-developed and well-nourished. No distress.  HENT:  Head: Normocephalic and atraumatic.  Right Ear: Tympanic membrane and external ear normal.  Left  Ear: Tympanic membrane and external ear normal.  Nose: Nose normal. Right sinus exhibits no maxillary sinus tenderness and no frontal sinus tenderness. Left sinus exhibits no maxillary sinus tenderness and no frontal sinus tenderness.  Mouth/Throat: Uvula is midline and oropharynx is clear and moist. No oropharyngeal exudate.  Eyes: Pupils are equal, round, and reactive to light.  Neck: Normal range of motion. Neck supple. No JVD present.  Cardiovascular: Normal rate and regular rhythm.   Pulmonary/Chest: Effort normal and breath sounds normal. No respiratory distress. He has no wheezes.  Abdominal: Soft. Bowel sounds are normal.  Lymphadenopathy:       Head (right side): No submental, no submandibular, no tonsillar and no preauricular adenopathy present.       Head (left side): No submental, no submandibular, no tonsillar and no preauricular adenopathy present.    He has no cervical adenopathy.  Neurological: He is alert and oriented to person, place, and time.  Skin: Skin is warm and dry. Capillary refill takes less than 2 seconds. No rash noted. He is not diaphoretic. No erythema.  Psychiatric: He has a normal mood and affect. His behavior is normal.  Nursing note and vitals reviewed.   Urgent Care Course     Procedures (including critical care time)  Labs Review Labs Reviewed - No data to display  Imaging Review No results found.         MDM   1. Viral URI with cough    Provided counseling on over-the-counter therapies for symptom management, encouraged to follow up with primary care, return to clinic, if symptoms persist past 10 days, go to the emergency room at any time if symptoms worsen such as developing wheezing, shortness of breath, or elevated fever unresolved with Tylenol or ibuprofen.    Dorena Bodo, NP 06/05/16 1309

## 2017-04-03 ENCOUNTER — Other Ambulatory Visit: Payer: Self-pay | Admitting: Cardiovascular Disease

## 2017-04-03 NOTE — Telephone Encounter (Signed)
Rx(s) sent to pharmacy electronically.  

## 2017-05-05 ENCOUNTER — Other Ambulatory Visit: Payer: Self-pay | Admitting: Cardiovascular Disease

## 2017-05-08 NOTE — Telephone Encounter (Signed)
Follow up    *STAT* If patient is at the pharmacy, call can be transferred to refill team.   1. Which medications need to be refilled? (please list name of each medication and dose if known) simvastatin (ZOCOR) 80 MG tablet and clopidogrel (PLAVIX) 75 MG tablet  2. Which pharmacy/location (including street and city if local pharmacy) is medication to be sent to? [address] [place] [phone]  3. Do they need a 30 day or 90 day supply? 90

## 2017-05-08 NOTE — Telephone Encounter (Signed)
REFILL 

## 2017-05-08 NOTE — Telephone Encounter (Signed)
New message  Pt verbalized that he is returning call for RN  He said that he still have not received his refill of his medications and  [place]

## 2017-05-09 ENCOUNTER — Telehealth: Payer: Self-pay | Admitting: Cardiovascular Disease

## 2017-05-09 MED ORDER — CLOPIDOGREL BISULFATE 75 MG PO TABS: 75.0000 mg | ORAL_TABLET | Freq: Every day | ORAL | 0 refills | Status: DC

## 2017-05-09 NOTE — Telephone Encounter (Signed)
Returned call to patient,  Patient very upset that he has called multiple times to get his prescription sent to [place] and no one has called back or sent his rx.    Patient very frustrated and states "something in that office needs to change".  Advised I would make our supervisor aware of the issue.    Apologized for the delay and for not getting a return call.   Patient states he is out of his Plavix and has not taken this in 6 days.     Per chart review-rx was sent to incorrect pharmacy.    [facility] [place] [facility] [place] [facility] [place] and does not want them sent in.  Offered to send in rx for Plavix so he can take today-to cover until he gets home tomorrow.   Patient agreed.    Plavix sent to pharmacy in [place]-called pharmacy to verify this was received.   Rx was received.   Again, apologized to patient for the frustrations and delay.

## 2017-05-09 NOTE — Telephone Encounter (Signed)
New Message    FYI: Patient called in today in reference to his prescription. He states that he called yesterday three times to see about getting his rx filled in [place]. I asked the patient does he want me to request that the rx refill be sent to [place]. He said no because he will be back tomorrow. But he wanted me to let Dr. Allyson SabalBerry know that he had been out of his medication for 6 day. Then he hung up.

## 2017-05-23 ENCOUNTER — Other Ambulatory Visit: Payer: Self-pay | Admitting: *Deleted

## 2017-05-23 DIAGNOSIS — E785 Hyperlipidemia, unspecified: Secondary | ICD-10-CM

## 2017-05-23 LAB — LIPID PANEL
CHOLESTEROL TOTAL: 164 mg/dL (ref 100–199)
Chol/HDL Ratio: 3.3 ratio (ref 0.0–5.0)
HDL: 49 mg/dL (ref >39)
LDL Calculated: 97 mg/dL (ref 0–99)
Triglycerides: 89 mg/dL (ref 0–149)
VLDL CHOLESTEROL CAL: 18 mg/dL (ref 5–40)

## 2017-05-23 LAB — HEPATIC FUNCTION PANEL
ALK PHOS: 109 IU/L (ref 39–117)
ALT: 26 IU/L (ref 0–44)
AST: 31 IU/L (ref 0–40)
Albumin: 4.5 g/dL (ref 3.5–5.5)
Bilirubin Total: 0.5 mg/dL (ref 0.0–1.2)
Bilirubin, Direct: 0.15 mg/dL (ref 0.00–0.40)
Total Protein: 7.3 g/dL (ref 6.0–8.5)

## 2017-05-26 ENCOUNTER — Ambulatory Visit (INDEPENDENT_AMBULATORY_CARE_PROVIDER_SITE_OTHER): Payer: 59 | Admitting: Cardiovascular Disease

## 2017-05-26 ENCOUNTER — Encounter: Payer: Self-pay | Admitting: Cardiovascular Disease

## 2017-05-26 DIAGNOSIS — E7800 Pure hypercholesterolemia, unspecified: Secondary | ICD-10-CM | POA: Diagnosis not present

## 2017-05-26 DIAGNOSIS — I10 Essential (primary) hypertension: Secondary | ICD-10-CM

## 2017-05-26 DIAGNOSIS — I2510 Atherosclerotic heart disease of native coronary artery without angina pectoris: Secondary | ICD-10-CM | POA: Diagnosis not present

## 2017-05-26 NOTE — Patient Instructions (Signed)
Medication Instructions: Your physician recommends that you continue on your current medications as directed. Please refer to the Current Medication list given to you today.  Labwork: Your physician recommends that you return for a FASTING lipid profile and hepatic function panel in 3 months.   Follow-Up: Your physician wants you to follow-up in: 1 year with Dr. Allyson SabalBerry. You will receive a reminder letter in the mail two months in advance. If you don't receive a letter, please call our office to schedule the follow-up appointment.  If you need a refill on your cardiac medications before your next appointment, please call your pharmacy.

## 2017-05-26 NOTE — Progress Notes (Signed)
05/26/2017 Phillip DeckAlan Ausburn   1960-11-17  [id number]  Primary Physician Farris HasMorrow, Aaron, MD Primary Cardiologist: Runell GessJonathan J Draydon Clairmont MD Nicholes CalamityFACP, FACC, FAHA, [place]  HPI:  Phillip Mercado is a 57 y.o.  mild to moderately overweight married Caucasian male father of 5 children, 2 daughters and grandchildren who I last saw in the office 04/01/16. He has a history of CAD status post LAD PCI and stenting with a Cypher drug-eluting stent of December 2005. He had 30% dominant RCA stenosis and normal in function. His other problems include hypertension and hyperlipidemia. He denies chest pain or shortness of breath. His last Myoview stress test performed 01/08/10 was nonischemic. Recent lipid profile performed 05/23/17 revealed a and LDL of 97 and HDL 49..     Current Meds  Medication Sig  . aspirin EC 81 MG tablet Take 81 mg by mouth daily.  . clopidogrel (PLAVIX) 75 MG tablet Take 1 tablet (75 mg total) by mouth daily.  . Coenzyme Q10 (CO Q 10) 100 MG CAPS Take 1 capsule by mouth daily.  . Multiple Vitamins-Minerals (MENS MULTIVITAMIN PLUS PO) Take 1 tablet by mouth daily.  . simvastatin (ZOCOR) 80 MG tablet Take 1 tablet (80 mg total) by mouth daily at 6 PM. NEED OV.  . vitamin E 1000 UNIT capsule Take 1,000 Units by mouth daily.     Allergies  Allergen Reactions  . Vytorin [Ezetimibe-Simvastatin] Other (See Comments)    Increased liver enzymes    Social History   Socioeconomic History  . Marital status: Married    Spouse name: Not on file  . Number of children: Not on file  . Years of education: Not on file  . Highest education level: Not on file  Occupational History  . Not on file  Social Needs  . Financial resource strain: Not on file  . Food insecurity:    Worry: Not on file    Inability: Not on file  . Transportation needs:    Medical: Not on file    Non-medical: Not on file  Tobacco Use  . Smoking status: Never Smoker  . Smokeless tobacco: Never Used  Substance and Sexual  Activity  . Alcohol use: No  . Drug use: No  . Sexual activity: Not on file  Lifestyle  . Physical activity:    Days per week: Not on file    Minutes per session: Not on file  . Stress: Not on file  Relationships  . Social connections:    Talks on phone: Not on file    Gets together: Not on file    Attends religious service: Not on file    Active member of club or organization: Not on file    Attends meetings of clubs or organizations: Not on file    Relationship status: Not on file  . Intimate partner violence:    Fear of current or ex partner: Not on file    Emotionally abused: Not on file    Physically abused: Not on file    Forced sexual activity: Not on file  Other Topics Concern  . Not on file  Social History Narrative  . Not on file     Review of Systems: General: negative for chills, fever, night sweats or weight changes.  Cardiovascular: negative for chest pain, dyspnea on exertion, edema, orthopnea, palpitations, paroxysmal nocturnal dyspnea or shortness of breath Dermatological: negative for rash Respiratory: negative for cough or wheezing Urologic: negative for hematuria Abdominal: negative for nausea, vomiting, diarrhea,  bright red blood per rectum, melena, or hematemesis Neurologic: negative for visual changes, syncope, or dizziness All other systems reviewed and are otherwise negative except as noted above.    Blood pressure 114/82, pulse 79, height 6' (1.829 m), weight 228 lb (103.4 kg).  General appearance: alert and no distress Neck: no adenopathy, no carotid bruit, no JVD, supple, symmetrical, trachea midline and thyroid not enlarged, symmetric, no tenderness/mass/nodules Lungs: clear to auscultation bilaterally Heart: regular rate and rhythm, S1, S2 normal, no murmur, click, rub or gallop Extremities: extremities normal, atraumatic, no cyanosis or edema Pulses: 2+ and symmetric Skin: Skin color, texture, turgor normal. No rashes or  lesions Neurologic: Alert and oriented X 3, normal strength and tone. Normal symmetric reflexes. Normal coordination and gait  EKG sinus rhythm at 79 without ST or T-wave changes. I personally reviewed this EKG.  ASSESSMENT AND PLAN:   Coronary artery disease History of CAD status post LAD PCI and stenting with a Cypher drug-eluting stent December 2005. He had 30% dominant RCA stenosis with normal LV function. His last Myoview performed 01/08/10 was nonischemic. He denies chest pain or shortness of breath.  Essential hypertension History of essential hypertension blood pressure measured 114/82 He is not on antihypertensive medications.  Hyperlipidemia History of hyperlipidemia on high-dose simvastatin with recent lipid profile performed 05/23/17 revealed LDL of 97 and HDL of 49. He does admit to dietary indiscretion. We talked about importance of diet and exercise. We will recheck in 3 months.      Runell Gess MD FACP,FACC,FAHA, [facility] 05/26/2017 9:18 AM

## 2017-05-26 NOTE — Assessment & Plan Note (Signed)
History of essential hypertension blood pressure measured 114/82 He is not on antihypertensive medications.

## 2017-05-26 NOTE — Assessment & Plan Note (Signed)
History of CAD status post LAD PCI and stenting with a Cypher drug-eluting stent December 2005. He had 30% dominant RCA stenosis with normal LV function. His last Myoview performed 01/08/10 was nonischemic. He denies chest pain or shortness of breath.

## 2017-05-26 NOTE — Assessment & Plan Note (Signed)
History of hyperlipidemia on high-dose simvastatin with recent lipid profile performed 05/23/17 revealed LDL of 97 and HDL of 49. He does admit to dietary indiscretion. We talked about importance of diet and exercise. We will recheck in 3 months.

## 2017-06-07 ENCOUNTER — Other Ambulatory Visit: Payer: Self-pay | Admitting: Cardiovascular Disease

## 2017-06-07 NOTE — Telephone Encounter (Signed)
REFILL 

## 2017-07-10 ENCOUNTER — Other Ambulatory Visit: Payer: Self-pay | Admitting: Cardiovascular Disease

## 2017-07-10 NOTE — Telephone Encounter (Signed)
REFILL 

## 2017-12-11 ENCOUNTER — Other Ambulatory Visit: Payer: Self-pay | Admitting: Cardiovascular Disease

## 2018-03-01 ENCOUNTER — Encounter: Payer: Self-pay | Admitting: Cardiovascular Disease

## 2018-03-01 ENCOUNTER — Ambulatory Visit (INDEPENDENT_AMBULATORY_CARE_PROVIDER_SITE_OTHER): Payer: 59 | Admitting: Cardiovascular Disease

## 2018-03-01 ENCOUNTER — Telehealth: Payer: Self-pay | Admitting: Cardiovascular Disease

## 2018-03-01 VITALS — BP 114/74 | HR 73 | Ht 72.0 in | Wt 228.0 lb

## 2018-03-01 DIAGNOSIS — I2510 Atherosclerotic heart disease of native coronary artery without angina pectoris: Secondary | ICD-10-CM | POA: Diagnosis not present

## 2018-03-01 DIAGNOSIS — R079 Chest pain, unspecified: Secondary | ICD-10-CM | POA: Diagnosis not present

## 2018-03-01 DIAGNOSIS — R0609 Other forms of dyspnea: Secondary | ICD-10-CM

## 2018-03-01 DIAGNOSIS — E7800 Pure hypercholesterolemia, unspecified: Secondary | ICD-10-CM | POA: Diagnosis not present

## 2018-03-01 DIAGNOSIS — I10 Essential (primary) hypertension: Secondary | ICD-10-CM | POA: Diagnosis not present

## 2018-03-01 MED ORDER — NITROGLYCERIN 0.4 MG SL SUBL: 0.4000 mg | SUBLINGUAL_TABLET | SUBLINGUAL | 3 refills | Status: DC | PRN

## 2018-03-01 NOTE — Assessment & Plan Note (Signed)
History of essential hypertension blood pressure measured today 114/74.  He is not on antihypertensive medications.

## 2018-03-01 NOTE — Progress Notes (Signed)
03/01/2018 Phillip DeckAlan Sula   Aug 13, 1960  [id number]  Primary Physician Farris HasMorrow, Aaron, MD Primary Cardiologist: Runell GessJonathan J Yasmina Chico MD FACP, [place]ACC, [place]HA, [place]AI  HPI:  Phillip Mercado is a 57 y.o.  mild to moderately overweight married Caucasian male father of 5 children, 2 daughters and grandchildren who I last saw in the 05/26/2017. He has a history of CAD status post LAD PCI and stenting with a Cypher drug-eluting stent of December 2005. He had 30% dominant RCA stenosis and normal in function. His other problems include hypertension and hyperlipidemia. He denies chest pain or shortness of breath. His last Myoview stress test performed 01/08/10 was nonischemic. Recent lipid profile performed3/19/19revealed a and LDL of 97 and HDL 49.  Since I saw him 9 months ago he has developed some exertional shortness of breath and some substernal chest pain.  He also has some left pectoral and upper extremity pain which is constant which sounds neuropathic.   Current Meds  Medication Sig  . aspirin EC 81 MG tablet Take 81 mg by mouth daily.  . clopidogrel (PLAVIX) 75 MG tablet TAKE 1 TABLET BY MOUTH EVERY DAY  . clopidogrel (PLAVIX) 75 MG tablet TAKE 1 TABLET(75 MG) BY MOUTH DAILY  . Coenzyme Q10 (CO Q 10) 100 MG CAPS Take 1 capsule by mouth daily.  . Multiple Vitamins-Minerals (MENS MULTIVITAMIN PLUS PO) Take 1 tablet by mouth daily.  . simvastatin (ZOCOR) 80 MG tablet TAKE 1 TABLET(80 MG) BY MOUTH DAILY AT 6 PM  . TURMERIC PO Take 1 tablet by mouth daily.  . vitamin C (ASCORBIC ACID) 500 MG tablet Take 500 mg by mouth daily.  . vitamin E 1000 UNIT capsule Take 1,000 Units by mouth daily.     Allergies  Allergen Reactions  . Vytorin [Ezetimibe-Simvastatin] Other (See Comments)    Increased liver enzymes    Social History   Socioeconomic History  . Marital status: Married    Spouse name: Not on file  . Number of children: Not on file  . Years of education: Not on file  . Highest education  level: Not on file  Occupational History  . Not on file  Social Needs  . Financial resource strain: Not on file  . Food insecurity:    Worry: Not on file    Inability: Not on file  . Transportation needs:    Medical: Not on file    Non-medical: Not on file  Tobacco Use  . Smoking status: Never Smoker  . Smokeless tobacco: Never Used  Substance and Sexual Activity  . Alcohol use: No  . Drug use: No  . Sexual activity: Not on file  Lifestyle  . Physical activity:    Days per week: Not on file    Minutes per session: Not on file  . Stress: Not on file  Relationships  . Social connections:    Talks on phone: Not on file    Gets together: Not on file    Attends religious service: Not on file    Active member of club or organization: Not on file    Attends meetings of clubs or organizations: Not on file    Relationship status: Not on file  . Intimate partner violence:    Fear of current or ex partner: Not on file    Emotionally abused: Not on file    Physically abused: Not on file    Forced sexual activity: Not on file  Other Topics Concern  . Not on  file  Social History Narrative  . Not on file     Review of Systems: General: negative for chills, fever, night sweats or weight changes.  Cardiovascular: negative for chest pain, dyspnea on exertion, edema, orthopnea, palpitations, paroxysmal nocturnal dyspnea or shortness of breath Dermatological: negative for rash Respiratory: negative for cough or wheezing Urologic: negative for hematuria Abdominal: negative for nausea, vomiting, diarrhea, bright red blood per rectum, melena, or hematemesis Neurologic: negative for visual changes, syncope, or dizziness All other systems reviewed and are otherwise negative except as noted above.    Blood pressure 114/74, pulse 73, height 6' (1.829 m), weight 228 lb (103.4 kg).  General appearance: alert and no distress Neck: no adenopathy, no carotid bruit, no JVD, supple,  symmetrical, trachea midline and thyroid not enlarged, symmetric, no tenderness/mass/nodules Lungs: clear to auscultation bilaterally Heart: regular rate and rhythm, S1, S2 normal, no murmur, click, rub or gallop Extremities: extremities normal, atraumatic, no cyanosis or edema Pulses: 2+ and symmetric Skin: Skin color, texture, turgor normal. No rashes or lesions Neurologic: Alert and oriented X 3, normal strength and tone. Normal symmetric reflexes. Normal coordination and gait  EKG sinus rhythm at 73 without ST or T wave changes. I Personally reviewed this EKG.  ASSESSMENT AND PLAN:   Coronary artery disease History of CAD status post LAD PCI and stenting using a Cypher drug-eluting stent by myself December 2005.  He had a 30% dominant RCA stenosis at that time with normal LV function.  His last Myoview performed 01/08/2010 was nonischemic.  He stopped exercising vigorously approximately year ago and recently has started going back in walking his dog and bagging leaves and is noticed increasing dyspnea on exertion and some substernal chest pain.  I am going to get a 2D echo and exercise Myoview stress test to further evaluate.  Essential hypertension History of essential hypertension blood pressure measured today 114/74.  He is not on antihypertensive medications.  Hyperlipidemia History of hyperlipidemia on statin therapy with lipid profile performed 05/23/2017 revealing LDL of 97 and HDL 49.      Runell GessJonathan J. Roena Sassaman MD FACP,FACC,FAHA, [facility] Center[facility]SCAI 03/01/2018 2:32 PM

## 2018-03-01 NOTE — Telephone Encounter (Signed)
Spoke to patient who states he has been having pain under left arm, intermittent chest tightness and increased fatigue x 5-6 weeks.   He states the arm pain is constant but the chest pain is intermittent and varies in severity.   Can occur at rest or exertion.  He states he walks his dogs daily and recently gets extremely fatigued after this.   He states he has been taking it easy but symptoms continue.    Requesting to be seen by Dr. Allyson SabalBerry.   appt scheduled for today at 230pm with Dr. Allyson SabalBerry.   Advised if symptoms change or worsen, proceed to ER for evaluation.  Patient verbalized understanding.

## 2018-03-01 NOTE — Patient Instructions (Signed)
Medication Instructions:  Your physician recommends that you continue on your current medications as directed. Please refer to the Current Medication list given to you today.  If you need a refill on your cardiac medications before your next appointment, please call your pharmacy.   Lab work: none If you have labs (blood work) drawn today and your tests are completely normal, you will receive your results only by: Marland Kitchen. MyChart Message (if you have MyChart) OR . A paper copy in the mail If you have any lab test that is abnormal or we need to change your treatment, we will call you to review the results.  Testing/Procedures: Your physician has requested that you have an echocardiogram. Echocardiography is a painless test that uses sound waves to create images of your heart. It provides your doctor with information about the size and shape of your heart and how well your heart's chambers and valves are working. This procedure takes approximately one hour. There are no restrictions for this procedure.  Your physician has requested that you have en exercise stress myoview. For further information please visit [email or web address]. Please follow instruction sheet, as given.  Follow-Up: [facility], you and your health needs are our priority.  As part of our continuing mission to provide you with exceptional heart care, we have created designated Provider Care Teams.  These Care Teams include your primary Cardiologist (physician) and Advanced Practice Providers (APPs -  Physician Assistants and Nurse Practitioners) who all work together to provide you with the care you need, when you need it. You will need a follow up appointment in 1 month. You may see Dr. Allyson SabalBerry or one of the following Advanced Practice Providers on your designated Care Team:   Corine ShelterLuke Kilroy, PA-C Judy PimpleKrista Kroeger, [place] JerseyPA-C . Marjie Skiffallie Goodrich, PA-C

## 2018-03-01 NOTE — Assessment & Plan Note (Signed)
History of CAD status post LAD PCI and stenting using a Cypher drug-eluting stent by myself December 2005.  He had a 30% dominant RCA stenosis at that time with normal LV function.  His last Myoview performed 01/08/2010 was nonischemic.  He stopped exercising vigorously approximately year ago and recently has started going back in walking his dog and bagging leaves and is noticed increasing dyspnea on exertion and some substernal chest pain.  I am going to get a 2D echo and exercise Myoview stress test to further evaluate.

## 2018-03-01 NOTE — Assessment & Plan Note (Signed)
History of hyperlipidemia on statin therapy with lipid profile performed 05/23/2017 revealing LDL of 97 and HDL 49.

## 2018-03-09 ENCOUNTER — Ambulatory Visit (HOSPITAL_COMMUNITY): Payer: 59 | Attending: Cardiology

## 2018-03-09 ENCOUNTER — Telehealth (HOSPITAL_COMMUNITY): Payer: Self-pay

## 2018-03-09 ENCOUNTER — Other Ambulatory Visit: Payer: Self-pay

## 2018-03-09 DIAGNOSIS — R079 Chest pain, unspecified: Secondary | ICD-10-CM | POA: Insufficient documentation

## 2018-03-09 DIAGNOSIS — R0609 Other forms of dyspnea: Secondary | ICD-10-CM | POA: Insufficient documentation

## 2018-03-09 NOTE — Telephone Encounter (Signed)
Encounter complete. 

## 2018-03-14 ENCOUNTER — Ambulatory Visit (HOSPITAL_COMMUNITY)
Admission: RE | Admit: 2018-03-14 | Discharge: 2018-03-14 | Disposition: A | Discharge status: Home / Self Care | Payer: 59 | Source: Ambulatory Visit | Attending: Cardiology | Admitting: Cardiology

## 2018-03-14 DIAGNOSIS — R0609 Other forms of dyspnea: Secondary | ICD-10-CM | POA: Diagnosis present

## 2018-03-14 DIAGNOSIS — R079 Chest pain, unspecified: Secondary | ICD-10-CM | POA: Diagnosis not present

## 2018-03-14 LAB — MYOCARDIAL PERFUSION IMAGING
CSEPEDS: 30 s
CSEPHR: 93 %
Estimated workload: 13.7 METS
Exercise duration (min): 12 min
LV dias vol: 116 mL (ref 62–150)
LVSYSVOL: 60 mL
MPHR: 163 {beats}/min
Peak HR: 153 {beats}/min
RPE: 17
Rest HR: 57 {beats}/min
SDS: 2
SRS: 4
SSS: 6
TID: 0.98

## 2018-03-14 MED ORDER — TECHNETIUM TC 99M TETROFOSMIN IV KIT
10.5000 | PACK | Freq: Once | INTRAVENOUS | Status: AC | PRN
  Administered 2018-03-14: 10.5 via INTRAVENOUS
  Filled 2018-03-14: qty 11

## 2018-03-14 MED ORDER — TECHNETIUM TC 99M TETROFOSMIN IV KIT
30.6000 | PACK | Freq: Once | INTRAVENOUS | Status: AC | PRN
  Administered 2018-03-14: 30.6 via INTRAVENOUS
  Filled 2018-03-14: qty 31

## 2018-03-26 ENCOUNTER — Telehealth: Payer: Self-pay | Admitting: Cardiovascular Disease

## 2018-03-26 ENCOUNTER — Other Ambulatory Visit: Payer: Self-pay | Admitting: Adult Health

## 2018-03-26 ENCOUNTER — Telehealth: Payer: Self-pay

## 2018-03-26 ENCOUNTER — Encounter: Payer: Self-pay | Admitting: Adult Health

## 2018-03-26 ENCOUNTER — Ambulatory Visit (INDEPENDENT_AMBULATORY_CARE_PROVIDER_SITE_OTHER): Payer: 59 | Admitting: Adult Health

## 2018-03-26 VITALS — BP 112/80 | HR 72 | Ht 72.0 in | Wt 229.0 lb

## 2018-03-26 DIAGNOSIS — E7800 Pure hypercholesterolemia, unspecified: Secondary | ICD-10-CM

## 2018-03-26 DIAGNOSIS — R9430 Abnormal result of cardiovascular function study, unspecified: Secondary | ICD-10-CM

## 2018-03-26 DIAGNOSIS — I25118 Atherosclerotic heart disease of native coronary artery with other forms of angina pectoris: Secondary | ICD-10-CM | POA: Diagnosis not present

## 2018-03-26 NOTE — Telephone Encounter (Signed)
Returned call to patient no answer.LMTC. 

## 2018-03-26 NOTE — Progress Notes (Signed)
Cardiology Office Note   Date:  03/26/2018   ID:  Phillip Mercado, DOB 1961-01-09, MRN [id number]  PCP:  Farris HasMorrow, Aaron, MD  Cardiologist:  Allyson SabalBerry  Chief Complaint  Patient presents with  . Coronary Artery Disease  . Chest Pain     History of Present Illness: Phillip Mercado is a 58 y.o. male who presents for ongoing assessment and management of CAD s/p PCI to the LAD with DES in 02/2004. He had 30% dominant RCA stenosis. His last Myoview performed 01/08/2010 was nonischemic. He was last seen by Dr. Allyson SabalBerry on 03/01/2018, and complained of left shoulder pain. He saw an orthopedist today for pain.   Echocardiogram on 03/09/2018 was normal with Grade 1 diastolic dysfunction. Stress was abnormal with upsloping ST segment depression of 1 mm noted during stress in V4 and V5 leads. Findings consistent with ischemia.  Found to be intermediate risk study.  Marland Kitchen.   He was given a steroid injection in the C-6 area this afternoon prior to coming to his appointment. He has yet to find relief of left shoulder pain. He is to start a po steroid dose pack. He continues to have chest discomfort while walking his dogs. He is concerned that his stent may be blocking up.  Past Medical History:  Diagnosis Date  . CAD (coronary artery disease) 01/08/2010   R/S MV - EF 58%; normal pattern of perfusion in all regions; no significant wall abnormalities noted; exercise capacity 13 METS, EKG negative for ischemia  . Claudication (HCC) 11/29/2005   bilateral ABIs no evidence of arterial insufficiency; bilateral PVRs, normal values; bilateral lower extremities demonstrate normal values with no suggestion of significant diameter reduction, dissection, aneurysmal dilation or anormality  . Hyperlipidemia   . Hypertension     Past Surgical History:  Procedure Laterality Date  . CARDIAC CATHETERIZATION  02/10/2004   mid L anterior descending PCI with 30.13 Cypher stent, resulting in a 90% lesion to a 0% residual  . CARDIAC  CATHETERIZATION  06/09/2004   30% stenosis in RCA; widely patent L anterior descending     Current Outpatient Medications  Medication Sig Dispense Refill  . aspirin EC 81 MG tablet Take 81 mg by mouth daily.    . clopidogrel (PLAVIX) 75 MG tablet TAKE 1 TABLET(75 MG) BY MOUTH DAILY 30 tablet 5  . Coenzyme Q10 (CO Q 10) 100 MG CAPS Take 1 capsule by mouth daily.    . Multiple Vitamins-Minerals (MENS MULTIVITAMIN PLUS PO) Take 1 tablet by mouth daily.    . nitroGLYCERIN (NITROSTAT) 0.4 MG SL tablet Place 1 tablet (0.4 mg total) under the tongue every 5 (five) minutes as needed for chest pain. 90 tablet 3  . simvastatin (ZOCOR) 80 MG tablet TAKE 1 TABLET(80 MG) BY MOUTH DAILY AT 6 PM 30 tablet 11  . vitamin C (ASCORBIC ACID) 500 MG tablet Take 500 mg by mouth daily.     No current facility-administered medications for this visit.     Allergies:   Vytorin [ezetimibe-simvastatin]    Social History:  The patient  reports that he has never smoked. He has never used smokeless tobacco. He reports that he does not drink alcohol or use drugs.   Family History:  The patient's family history includes Stroke in his paternal grandmother; Sudden death in his paternal grandfather.    ROS: All other systems are reviewed and negative. Unless otherwise mentioned in H&P    PHYSICAL EXAM: VS:  BP 112/80   Pulse 72  Ht 6' (1.829 m)   Wt 229 lb (103.9 kg)   BMI 31.06 kg/m  , BMI Body mass index is 31.06 kg/m. GEN: Well nourished, well developed, in no acute distress HEENT: normal Neck: no JVD, carotid bruits, or masses Cardiac: RRR; no murmurs, rubs, or gallops,no edema  Respiratory:  Clear to auscultation bilaterally, normal work of breathing GI: soft, nontender, nondistended, + BS MS: no deformity or atrophy Skin: warm and dry, no rash Neuro:  Strength and sensation are intact Psych: euthymic mood, full affect   EKG:  NSR rate of 72 bpm.   Recent Labs: 05/23/2017: ALT 26    Lipid  Panel    Component Value Date/Time   CHOL 164 05/23/2017 0856   TRIG 89 05/23/2017 0856   HDL 49 05/23/2017 0856   CHOLHDL 3.3 05/23/2017 0856   CHOLHDL 3.2 03/29/2016 0847   VLDL 22 03/29/2016 0847   LDLCALC 97 05/23/2017 0856      Wt Readings from Last 3 Encounters:  03/26/18 229 lb (103.9 kg)  03/14/18 228 lb (103.4 kg)  03/01/18 228 lb (103.4 kg)      Other studies Reviewed: Echocardiogram 03/30/2018. Left ventricle: The cavity size was normal. Systolic function was   normal. The estimated ejection fraction was in the range of 55%   to 60%. Wall motion was normal; there were no regional wall   motion abnormalities. Doppler parameters are consistent with   abnormal left ventricular relaxation (grade 1 diastolic   dysfunction). - Aortic valve: Transvalvular velocity was within the normal range.   There was no stenosis. There was no regurgitation. - Mitral valve: Transvalvular velocity was within the normal range.   There was no evidence for stenosis. There was no regurgitation. - Right ventricle: The cavity size was normal. Wall thickness was   normal. Systolic function was normal. - Tricuspid valve: There was trivial regurgitation. - Pulmonary arteries: Systolic pressure was within the normal   range. PA peak pressure: 23 mm Hg (S).  ASSESSMENT AND PLAN:  1. CAD: Known PCI to the LAD with DES in 2005. He had a 30% dominant RCA stenosis. His stress test was abnormal and found to be an intermediate risk. With  recurrent chest pain and abnormal stress he is being sent for cardiac cath. This has been discussed with Dr. Rennis Golden who is DOD on site. He is in agreement that diagnostic cath would be beneficial in this setting.   The patient understands that risks include but are not limited to stroke (1 in 1000), death (1 in 1000), kidney failure [usually temporary] (1 in 500), bleeding (1 in 200), allergic reaction [possibly serious] (1 in 200), and agrees to proceed.   He is  scheduled with Dr. Katrinka Blazing on 03/30/2018. He will continue Plavix and ASA.   2. Hyperlipidemia:  Continue simvastatin.   Current medicines are reviewed at length with the patient today.    Labs/ tests ordered today include: Cardiac cath with pre-cath labs.   Bettey Mare. Liborio Nixon, ANP, AACC   03/26/2018 5:45 PM    [facility] [address] Office [phone] Fax [phone]

## 2018-03-26 NOTE — Telephone Encounter (Signed)
Received a call from patient he stated she scheduled appointment with Phillip ReiningKathryn Lawrence DNP today to discuss myoview results and pain in left shoulder.He cancelled appointment with Dr.Berry 03/27/18 and rescheduled appt to 04/04/18. Stated he also scheduled appointment with [facility] this afternoon.Stated he might be alittle late for appointment with Phillip Mercado.Advised to call back if he will be late.

## 2018-03-26 NOTE — Telephone Encounter (Signed)
Received call back from patient.He stated he has appointment with Dr.Berry 1/29 to discuss myoview results.Stated he would like to see Dr.Berry sooner.Stated he continues to have pain in left shoulder that radiates down left arm.Appointment moved up with Dr.Berry tomorrow 1/21 at 4:15 pm.

## 2018-03-26 NOTE — H&P (View-Only) (Signed)
Cardiology Office Note   Date:  03/26/2018   ID:  Phillip Mercado, DOB 09/27/1960, MRN [id number]  PCP:  Morrow, Aaron, MD  Cardiologist:  Berry  Chief Complaint  Patient presents with  . Coronary Artery Disease  . Chest Pain     History of Present Illness: Phillip Mercado is a 57 y.o. male who presents for ongoing assessment and management of CAD s/p PCI to the LAD with DES in 02/2004. He had 30% dominant RCA stenosis. His last Myoview performed 01/08/2010 was nonischemic. He was last seen by Dr. Berry on 03/01/2018, and complained of left shoulder pain. He saw an orthopedist today for pain.   Echocardiogram on 03/09/2018 was normal with Grade 1 diastolic dysfunction. Stress was abnormal with upsloping ST segment depression of 1 mm noted during stress in V4 and V5 leads. Findings consistent with ischemia.  Found to be intermediate risk study.  .   He was given a steroid injection in the C-6 area this afternoon prior to coming to his appointment. He has yet to find relief of left shoulder pain. He is to start a po steroid dose pack. He continues to have chest discomfort while walking his dogs. He is concerned that his stent may be blocking up.  Past Medical History:  Diagnosis Date  . CAD (coronary artery disease) 01/08/2010   R/S MV - EF 58%; normal pattern of perfusion in all regions; no significant wall abnormalities noted; exercise capacity 13 METS, EKG negative for ischemia  . Claudication (HCC) 11/29/2005   bilateral ABIs no evidence of arterial insufficiency; bilateral PVRs, normal values; bilateral lower extremities demonstrate normal values with no suggestion of significant diameter reduction, dissection, aneurysmal dilation or anormality  . Hyperlipidemia   . Hypertension     Past Surgical History:  Procedure Laterality Date  . CARDIAC CATHETERIZATION  02/10/2004   mid L anterior descending PCI with 30.13 Cypher stent, resulting in a 90% lesion to a 0% residual  . CARDIAC  CATHETERIZATION  06/09/2004   30% stenosis in RCA; widely patent L anterior descending     Current Outpatient Medications  Medication Sig Dispense Refill  . aspirin EC 81 MG tablet Take 81 mg by mouth daily.    . clopidogrel (PLAVIX) 75 MG tablet TAKE 1 TABLET(75 MG) BY MOUTH DAILY 30 tablet 5  . Coenzyme Q10 (CO Q 10) 100 MG CAPS Take 1 capsule by mouth daily.    . Multiple Vitamins-Minerals (MENS MULTIVITAMIN PLUS PO) Take 1 tablet by mouth daily.    . nitroGLYCERIN (NITROSTAT) 0.4 MG SL tablet Place 1 tablet (0.4 mg total) under the tongue every 5 (five) minutes as needed for chest pain. 90 tablet 3  . simvastatin (ZOCOR) 80 MG tablet TAKE 1 TABLET(80 MG) BY MOUTH DAILY AT 6 PM 30 tablet 11  . vitamin C (ASCORBIC ACID) 500 MG tablet Take 500 mg by mouth daily.     No current facility-administered medications for this visit.     Allergies:   Vytorin [ezetimibe-simvastatin]    Social History:  The patient  reports that he has never smoked. He has never used smokeless tobacco. He reports that he does not drink alcohol or use drugs.   Family History:  The patient's family history includes Stroke in his paternal grandmother; Sudden death in his paternal grandfather.    ROS: All other systems are reviewed and negative. Unless otherwise mentioned in H&P    PHYSICAL EXAM: VS:  BP 112/80   Pulse 72     Ht 6' (1.829 m)   Wt 229 lb (103.9 kg)   BMI 31.06 kg/m  , BMI Body mass index is 31.06 kg/m. GEN: Well nourished, well developed, in no acute distress HEENT: normal Neck: no JVD, carotid bruits, or masses Cardiac: RRR; no murmurs, rubs, or gallops,no edema  Respiratory:  Clear to auscultation bilaterally, normal work of breathing GI: soft, nontender, nondistended, + BS MS: no deformity or atrophy Skin: warm and dry, no rash Neuro:  Strength and sensation are intact Psych: euthymic mood, full affect   EKG:  NSR rate of 72 bpm.   Recent Labs: 05/23/2017: ALT 26    Lipid  Panel    Component Value Date/Time   CHOL 164 05/23/2017 0856   TRIG 89 05/23/2017 0856   HDL 49 05/23/2017 0856   CHOLHDL 3.3 05/23/2017 0856   CHOLHDL 3.2 03/29/2016 0847   VLDL 22 03/29/2016 0847   LDLCALC 97 05/23/2017 0856      Wt Readings from Last 3 Encounters:  03/26/18 229 lb (103.9 kg)  03/14/18 228 lb (103.4 kg)  03/01/18 228 lb (103.4 kg)      Other studies Reviewed: Echocardiogram 03/09/2018. Left ventricle: The cavity size was normal. Systolic function was   normal. The estimated ejection fraction was in the range of 55%   to 60%. Wall motion was normal; there were no regional wall   motion abnormalities. Doppler parameters are consistent with   abnormal left ventricular relaxation (grade 1 diastolic   dysfunction). - Aortic valve: Transvalvular velocity was within the normal range.   There was no stenosis. There was no regurgitation. - Mitral valve: Transvalvular velocity was within the normal range.   There was no evidence for stenosis. There was no regurgitation. - Right ventricle: The cavity size was normal. Wall thickness was   normal. Systolic function was normal. - Tricuspid valve: There was trivial regurgitation. - Pulmonary arteries: Systolic pressure was within the normal   range. PA peak pressure: 23 mm Hg (S).  ASSESSMENT AND PLAN:  1. CAD: Known PCI to the LAD with DES in 2005. He had a 30% dominant RCA stenosis. His stress test was abnormal and found to be an intermediate risk. With  recurrent chest pain and abnormal stress he is being sent for cardiac cath. This has been discussed with Dr. Hilty who is DOD on site. He is in agreement that diagnostic cath would be beneficial in this setting.   The patient understands that risks include but are not limited to stroke (1 in 1000), death (1 in 1000), kidney failure [usually temporary] (1 in 500), bleeding (1 in 200), allergic reaction [possibly serious] (1 in 200), and agrees to proceed.   He is  scheduled with Dr. Smith on 03/30/2018. He will continue Plavix and ASA.   2. Hyperlipidemia:  Continue simvastatin.   Current medicines are reviewed at length with the patient today.    Labs/ tests ordered today include: Cardiac cath with pre-cath labs.   Lawrance Wiedemann M. Jozette Castrellon DNP, ANP, AACC   03/26/2018 5:45 PM    [facility] [address] Office [phone] Fax [phone] 

## 2018-03-26 NOTE — Patient Instructions (Addendum)
FOLLOW UP: IN 2 WEEKS WITH KATHRYN LAWRENCE DNP,AACC IF PRIMARY CARDIOLOGIST (BERRY) IS UNAVAILABLE.    Ramar Grass  03/26/2018  You are scheduled for a Cardiac Catheterization on Friday, January 24 with Dr. Verdis Prime.  1. Please arrive at the [facility] (Main Entrance A) at [facility]: [address] [place], [place] [address] at 7:00 AM (This time is two hours before your procedure to ensure your preparation). Free valet parking service is available.   Special note: Every effort is made to have your procedure done on time. Please understand that emergencies sometimes delay scheduled procedures.  2. Diet: Do not eat solid foods after midnight.  The patient may have clear liquids until 5am upon the day of the procedure.  3. Labs: You will need to have blood drawn on Monday, January 20 at [facility] [address], [place]  Open: 8am - 5pm (Lunch 12:30 - 1:30)   Phone: [phone]. You do not need to be fasting.  4. Medication instructions in preparation for your procedure:   Contrast Allergy: No  On the morning of your procedure, take your Aspirin and any morning medicines NOT listed above.  You may use sips of water.  5. Plan for one night stay--bring personal belongings. 6. Bring a current list of your medications and current insurance cards. 7. You MUST have a responsible person to drive you home. 8. Someone MUST be with you the first 24 hours after you arrive home or your discharge will be delayed. 9. Please wear clothes that are easy to get on and off and wear slip-on shoes.  Thank you for allowing [place] to care for you!   -- [facility]

## 2018-03-26 NOTE — Telephone Encounter (Signed)
New message   Patient has additional questions about test results.

## 2018-03-27 ENCOUNTER — Ambulatory Visit: Payer: 59 | Admitting: Cardiovascular Disease

## 2018-03-27 ENCOUNTER — Telehealth: Payer: Self-pay

## 2018-03-27 LAB — CBC
Hematocrit: 48 % (ref 37.5–51.0)
Hemoglobin: 16.1 g/dL (ref 13.0–17.7)
MCH: 29.7 pg (ref 26.6–33.0)
MCHC: 33.5 g/dL (ref 31.5–35.7)
MCV: 88 fL (ref 79–97)
PLATELETS: 250 10*3/uL (ref 150–450)
RBC: 5.43 x10E6/uL (ref 4.14–5.80)
RDW: 13 % (ref 11.6–15.4)
WBC: 4.9 10*3/uL (ref 3.4–10.8)

## 2018-03-27 LAB — BASIC METABOLIC PANEL
BUN / CREAT RATIO: 9 (ref 9–20)
BUN: 12 mg/dL (ref 6–24)
CO2: 25 mmol/L (ref 20–29)
Calcium: 10.1 mg/dL (ref 8.7–10.2)
Chloride: 100 mmol/L (ref 96–106)
Creatinine, Ser: 1.27 mg/dL (ref 0.76–1.27)
GFR calc Af Amer: 72 mL/min/{1.73_m2} (ref >59)
GFR calc non Af Amer: 62 mL/min/{1.73_m2} (ref >59)
Glucose: 93 mg/dL (ref 65–99)
POTASSIUM: 4.8 mmol/L (ref 3.5–5.2)
Sodium: 142 mmol/L (ref 134–144)

## 2018-03-27 NOTE — Addendum Note (Signed)
Addended by: Jodelle Gross on: 03/27/2018 09:26 AM   Modules accepted: Orders

## 2018-03-27 NOTE — Telephone Encounter (Signed)
Received call from Dr Allyson Sabal, he informed that he wants to do his own pt's cath's. Rescheduled from 03-30-2018 to 04-02-2018, per his direction. Pt notified of new date, arrival time and all directions below:  [facility] CARDIOVASCULAR DIVISION [facility] [address] [place] [address] Dept: [phone] Loc: [phone]   Paramveer Swoveland                03/26/2018  You are scheduled for a Cardiac Catheterization on Monday, January 27 with Dr. Nanetta Batty.  1. Please arrive at the [facility] (Main Entrance A) at [facility]: [address] [place], [place] [address] at 9:30 AM (This time is two hours before your procedure to ensure your preparation). Free valet parking service is available.   Special note: Every effort is made to have your procedure done on time. Please understand that emergencies sometimes delay scheduled procedures.  2. Diet: Do not eat solid foods after midnight.  The patient may have clear liquids until 5am upon the day of the procedure.  3. Labs: You will need to have blood drawn on Monday, January 20 at [facility] [address], [place]  Open: 8am - 5pm (Lunch 12:30 - 1:30)   Phone: [phone]. You do not need to be fasting.  4. Medication instructions in preparation for your procedure:   Contrast Allergy: No  On the morning of your procedure, take your Aspirin and any morning medicines NOT listed above.  You may use sips of water.  5. Plan for one night stay--bring personal belongings. 6. Bring a current list of your medications and current insurance cards. 7. You MUST have a responsible person to drive you home. 8. Someone MUST be with you the first 24 hours after you arrive home or your discharge will be delayed. 9. Please wear clothes that are easy to get on and off and wear slip-on shoes.  Thank you for allowing [place] to care for you!   --  Invasive  Cardiovascular services

## 2018-03-29 ENCOUNTER — Telehealth: Payer: Self-pay | Admitting: *Deleted

## 2018-03-29 NOTE — Telephone Encounter (Signed)
Pt contacted pre-catheterization scheduled at [facility] for: Monday April 02, 2018 11:30 AM Verified arrival time and place: [facility] Main Entrance A at: 9:30 AM  No solid food after midnight prior to cath, clear liquids until 5 AM day of procedure. Verified no diabetes medications: yes  AM meds can be  taken pre-cath with sip of water including: ASA 81 mg Clopidogrel 75 mg  Confirmed patient has responsible person to drive home post procedure and for 24 hours after you arrive home: yes

## 2018-04-02 ENCOUNTER — Ambulatory Visit (HOSPITAL_COMMUNITY)
Admission: RE | Admit: 2018-04-02 | Discharge: 2018-04-03 | Disposition: A | Discharge status: Home / Self Care | Payer: 59 | Attending: Cardiovascular Disease | Admitting: Cardiovascular Disease

## 2018-04-02 ENCOUNTER — Other Ambulatory Visit: Payer: Self-pay

## 2018-04-02 ENCOUNTER — Ambulatory Visit (HOSPITAL_COMMUNITY)
Admission: RE | Disposition: A | Discharge status: Home / Self Care | Payer: Self-pay | Source: Home / Self Care | Attending: Cardiovascular Disease

## 2018-04-02 DIAGNOSIS — Z823 Family history of stroke: Secondary | ICD-10-CM | POA: Insufficient documentation

## 2018-04-02 DIAGNOSIS — Z888 Allergy status to other drugs, medicaments and biological substances status: Secondary | ICD-10-CM | POA: Diagnosis not present

## 2018-04-02 DIAGNOSIS — Z7982 Long term (current) use of aspirin: Secondary | ICD-10-CM | POA: Diagnosis not present

## 2018-04-02 DIAGNOSIS — Z79899 Other long term (current) drug therapy: Secondary | ICD-10-CM | POA: Diagnosis not present

## 2018-04-02 DIAGNOSIS — E785 Hyperlipidemia, unspecified: Secondary | ICD-10-CM | POA: Insufficient documentation

## 2018-04-02 DIAGNOSIS — I10 Essential (primary) hypertension: Secondary | ICD-10-CM | POA: Diagnosis not present

## 2018-04-02 DIAGNOSIS — I2510 Atherosclerotic heart disease of native coronary artery without angina pectoris: Secondary | ICD-10-CM | POA: Diagnosis present

## 2018-04-02 DIAGNOSIS — Z7902 Long term (current) use of antithrombotics/antiplatelets: Secondary | ICD-10-CM | POA: Insufficient documentation

## 2018-04-02 DIAGNOSIS — Z955 Presence of coronary angioplasty implant and graft: Secondary | ICD-10-CM | POA: Diagnosis not present

## 2018-04-02 DIAGNOSIS — I25110 Atherosclerotic heart disease of native coronary artery with unstable angina pectoris: Secondary | ICD-10-CM | POA: Insufficient documentation

## 2018-04-02 DIAGNOSIS — I200 Unstable angina: Secondary | ICD-10-CM | POA: Diagnosis present

## 2018-04-02 HISTORY — PX: CORONARY STENT INTERVENTION: CATH118234

## 2018-04-02 HISTORY — PX: LEFT HEART CATH AND CORONARY ANGIOGRAPHY: CATH118249

## 2018-04-02 HISTORY — DX: Pneumonia, unspecified organism: J18.9

## 2018-04-02 LAB — POCT ACTIVATED CLOTTING TIME: Activated Clotting Time: 345 seconds

## 2018-04-02 SURGERY — LEFT HEART CATH AND CORONARY ANGIOGRAPHY
Anesthesia: LOCAL

## 2018-04-02 MED ORDER — SODIUM CHLORIDE 0.9 % IV SOLN: 250.0000 mL | INTRAVENOUS | Status: DC | PRN

## 2018-04-02 MED ORDER — MORPHINE SULFATE (PF) 2 MG/ML IV SOLN: 2.0000 mg | INTRAVENOUS | Status: DC | PRN

## 2018-04-02 MED ORDER — IOHEXOL 350 MG/ML SOLN
INTRAVENOUS | Status: DC | PRN
  Administered 2018-04-02: 150 mL via INTRA_ARTERIAL

## 2018-04-02 MED ORDER — BIVALIRUDIN BOLUS VIA INFUSION - CUPID
INTRAVENOUS | Status: DC | PRN
  Administered 2018-04-02: 77.55 mg via INTRAVENOUS

## 2018-04-02 MED ORDER — SODIUM CHLORIDE 0.9 % IV SOLN
INTRAVENOUS | Status: DC | PRN
  Administered 2018-04-02: 1.75 mg/kg/h via INTRAVENOUS

## 2018-04-02 MED ORDER — BIVALIRUDIN TRIFLUOROACETATE 250 MG IV SOLR
INTRAVENOUS | Status: AC
  Filled 2018-04-02: qty 250

## 2018-04-02 MED ORDER — LIDOCAINE HCL (PF) 1 % IJ SOLN
INTRAMUSCULAR | Status: DC | PRN
  Administered 2018-04-02: 2 mL via SUBCUTANEOUS
  Administered 2018-04-02: 15 mL via SUBCUTANEOUS

## 2018-04-02 MED ORDER — CLOPIDOGREL BISULFATE 300 MG PO TABS
ORAL_TABLET | ORAL | Status: AC
  Filled 2018-04-02: qty 1

## 2018-04-02 MED ORDER — FENTANYL CITRATE (PF) 100 MCG/2ML IJ SOLN
INTRAMUSCULAR | Status: DC | PRN
  Administered 2018-04-02: 25 ug via INTRAVENOUS

## 2018-04-02 MED ORDER — SODIUM CHLORIDE 0.9 % WEIGHT BASED INFUSION
1.0000 mL/kg/h | INTRAVENOUS | Status: DC
  Administered 2018-04-02: 250 mL via INTRAVENOUS
  Administered 2018-04-02: 150 mL via INTRAVENOUS

## 2018-04-02 MED ORDER — LIDOCAINE HCL (PF) 1 % IJ SOLN
INTRAMUSCULAR | Status: AC
  Filled 2018-04-02: qty 30

## 2018-04-02 MED ORDER — ATROPINE SULFATE 1 MG/10ML IJ SOSY
PREFILLED_SYRINGE | INTRAMUSCULAR | Status: DC | PRN
  Administered 2018-04-02 (×2): 0.5 mg via INTRAVENOUS

## 2018-04-02 MED ORDER — SODIUM CHLORIDE 0.9% FLUSH: 3.0000 mL | INTRAVENOUS | Status: DC | PRN

## 2018-04-02 MED ORDER — LABETALOL HCL 5 MG/ML IV SOLN: 10.0000 mg | INTRAVENOUS | Status: AC | PRN

## 2018-04-02 MED ORDER — SODIUM CHLORIDE 0.9 % WEIGHT BASED INFUSION
3.0000 mL/kg/h | INTRAVENOUS | Status: DC
  Administered 2018-04-02: 3 mL/kg/h via INTRAVENOUS

## 2018-04-02 MED ORDER — ACETAMINOPHEN 325 MG PO TABS: 650.0000 mg | ORAL_TABLET | ORAL | Status: DC | PRN

## 2018-04-02 MED ORDER — CLOPIDOGREL BISULFATE 75 MG PO TABS: 75.0000 mg | ORAL_TABLET | Freq: Every day | ORAL | Status: DC

## 2018-04-02 MED ORDER — ONDANSETRON HCL 4 MG/2ML IJ SOLN: 4.0000 mg | Freq: Four times a day (QID) | INTRAMUSCULAR | Status: DC | PRN

## 2018-04-02 MED ORDER — HYDRALAZINE HCL 20 MG/ML IJ SOLN: 5.0000 mg | INTRAMUSCULAR | Status: AC | PRN

## 2018-04-02 MED ORDER — ANGIOPLASTY BOOK
Freq: Once | Status: AC
  Administered 2018-04-02: 1
  Filled 2018-04-02: qty 1

## 2018-04-02 MED ORDER — HEPARIN (PORCINE) IN NACL 1000-0.9 UT/500ML-% IV SOLN
INTRAVENOUS | Status: AC
  Filled 2018-04-02: qty 1000

## 2018-04-02 MED ORDER — VERAPAMIL HCL 2.5 MG/ML IV SOLN
INTRAVENOUS | Status: AC
  Filled 2018-04-02: qty 2

## 2018-04-02 MED ORDER — FENTANYL CITRATE (PF) 100 MCG/2ML IJ SOLN
INTRAMUSCULAR | Status: AC
  Filled 2018-04-02: qty 2

## 2018-04-02 MED ORDER — SODIUM CHLORIDE 0.9 % IV SOLN: INTRAVENOUS | Status: AC

## 2018-04-02 MED ORDER — SODIUM CHLORIDE 0.9% FLUSH: 3.0000 mL | Freq: Two times a day (BID) | INTRAVENOUS | Status: DC

## 2018-04-02 MED ORDER — ASPIRIN 81 MG PO CHEW: 81.0000 mg | CHEWABLE_TABLET | Freq: Every day | ORAL | Status: DC

## 2018-04-02 MED ORDER — HEPARIN (PORCINE) IN NACL 1000-0.9 UT/500ML-% IV SOLN
INTRAVENOUS | Status: DC | PRN
  Administered 2018-04-02 (×2): 500 mL

## 2018-04-02 MED ORDER — ASPIRIN EC 81 MG PO TBEC: 81.0000 mg | DELAYED_RELEASE_TABLET | Freq: Every day | ORAL | Status: DC

## 2018-04-02 MED ORDER — MIDAZOLAM HCL 2 MG/2ML IJ SOLN
INTRAMUSCULAR | Status: DC | PRN
  Administered 2018-04-02: 1 mg via INTRAVENOUS

## 2018-04-02 MED ORDER — ASPIRIN 81 MG PO CHEW: 81.0000 mg | CHEWABLE_TABLET | ORAL | Status: DC

## 2018-04-02 MED ORDER — CLOPIDOGREL BISULFATE 300 MG PO TABS
ORAL_TABLET | ORAL | Status: DC | PRN
  Administered 2018-04-02: 300 mg via ORAL

## 2018-04-02 MED ORDER — MIDAZOLAM HCL 2 MG/2ML IJ SOLN
INTRAMUSCULAR | Status: AC
  Filled 2018-04-02: qty 2

## 2018-04-02 SURGICAL SUPPLY — 20 items
BALLN SAPPHIRE 2.0X12 (BALLOONS) ×2
BALLN SAPPHIRE 2.5X12 (BALLOONS) ×2
BALLN SAPPHIRE ~~LOC~~ 3.25X12 (BALLOONS) ×2 IMPLANT
BALLOON SAPPHIRE 2.0X12 (BALLOONS) ×1 IMPLANT
BALLOON SAPPHIRE 2.5X12 (BALLOONS) ×1 IMPLANT
CATH INFINITI 5FR MULTPACK ANG (CATHETERS) ×2 IMPLANT
CATH VISTA GUIDE 6FR JR4 (CATHETERS) ×2 IMPLANT
GLIDESHEATH SLEND A-KIT 6F 22G (SHEATH) ×2 IMPLANT
KIT ENCORE 26 ADVANTAGE (KITS) ×2 IMPLANT
KIT HEART LEFT (KITS) ×2 IMPLANT
PACK CARDIAC CATHETERIZATION (CUSTOM PROCEDURE TRAY) ×2 IMPLANT
SHEATH PINNACLE 5F 10CM (SHEATH) ×2 IMPLANT
SHEATH PINNACLE 6F 10CM (SHEATH) ×2 IMPLANT
STENT SYNERGY DES 3X12 (Permanent Stent) ×2 IMPLANT
STENT SYNERGY DES 3X16 (Permanent Stent) ×2 IMPLANT
SYR MEDRAD MARK 7 150ML (SYRINGE) ×2 IMPLANT
TRANSDUCER W/STOPCOCK (MISCELLANEOUS) ×2 IMPLANT
TUBING CIL FLEX 10 FLL-RA (TUBING) ×2 IMPLANT
WIRE ASAHI PROWATER 180CM (WIRE) ×2 IMPLANT
WIRE EMERALD 3MM-J .035X150CM (WIRE) ×2 IMPLANT

## 2018-04-02 NOTE — Progress Notes (Signed)
Pt became diaphoretic and clammy immediately following his IV start.  He states he feels "funny".  Pt placed in trendelenberg and placed on 2 L O2 by [place].  Pt then passed out for a very brief period of time(3 sec) awoke and stated "what happened"  See VS record.  Vs returned to normal and pt states he feels fine again .

## 2018-04-02 NOTE — Progress Notes (Signed)
Site area: right groin  Site Prior to Removal:  Level 0  Pressure Applied For 25 MINUTES    Minutes Beginning at 1810  Manual:   Yes.    Patient Status During Pull:  WNL   Post Pull Groin Site:  Level 0  Post Pull Instructions Given:  Yes.    Post Pull Pulses Present:  Yes.    Dressing Applied:  Yes.    Comments:  Tolerated procedure well

## 2018-04-02 NOTE — Interval H&P Note (Signed)
Cath Lab Visit (complete for each Cath Lab visit)  Clinical Evaluation Leading to the Procedure:   ACS: No.  Non-ACS:    Anginal Classification: CCS II  Anti-ischemic medical therapy: Minimal Therapy (1 class of medications)  Non-Invasive Test Results: Low-risk stress test findings: cardiac mortality <1%/year  Prior CABG: No previous CABG      History and Physical Interval Note:  04/02/2018 1:51 PM  Phillip Mercado  has presented today for surgery, with the diagnosis of cp - cad  The various methods of treatment have been discussed with the patient and family. After consideration of risks, benefits and other options for treatment, the patient has consented to  Procedure(s): LEFT HEART CATH AND CORONARY ANGIOGRAPHY (N/A) as a surgical intervention .  The patient's history has been reviewed, patient examined, no change in status, stable for surgery.  I have reviewed the patient's chart and labs.  Questions were answered to the patient's satisfaction.     Phillip Mercado

## 2018-04-03 ENCOUNTER — Telehealth: Payer: Self-pay | Admitting: Cardiovascular Disease

## 2018-04-03 ENCOUNTER — Encounter (HOSPITAL_COMMUNITY): Payer: Self-pay | Admitting: Cardiovascular Disease

## 2018-04-03 DIAGNOSIS — E785 Hyperlipidemia, unspecified: Secondary | ICD-10-CM | POA: Diagnosis not present

## 2018-04-03 DIAGNOSIS — Z955 Presence of coronary angioplasty implant and graft: Secondary | ICD-10-CM

## 2018-04-03 DIAGNOSIS — I10 Essential (primary) hypertension: Secondary | ICD-10-CM | POA: Diagnosis not present

## 2018-04-03 DIAGNOSIS — I25110 Atherosclerotic heart disease of native coronary artery with unstable angina pectoris: Secondary | ICD-10-CM | POA: Diagnosis not present

## 2018-04-03 LAB — BASIC METABOLIC PANEL
Anion gap: 7 (ref 5–15)
BUN: 15 mg/dL (ref 6–20)
CO2: 24 mmol/L (ref 22–32)
Calcium: 8.9 mg/dL (ref 8.9–10.3)
Chloride: 105 mmol/L (ref 98–111)
Creatinine, Ser: 1.12 mg/dL (ref 0.61–1.24)
GFR calc Af Amer: >60 mL/min (ref >60)
GFR calc non Af Amer: >60 mL/min (ref >60)
Glucose, Bld: 118 mg/dL — ABNORMAL HIGH (ref 70–99)
Potassium: 4.3 mmol/L (ref 3.5–5.1)
Sodium: 136 mmol/L (ref 135–145)

## 2018-04-03 LAB — CBC
HCT: 46.4 % (ref 39.0–52.0)
Hemoglobin: 15.3 g/dL (ref 13.0–17.0)
MCH: 29.1 pg (ref 26.0–34.0)
MCHC: 33 g/dL (ref 30.0–36.0)
MCV: 88.4 fL (ref 80.0–100.0)
Platelets: 231 10*3/uL (ref 150–400)
RBC: 5.25 MIL/uL (ref 4.22–5.81)
RDW: 13.3 % (ref 11.5–15.5)
WBC: 11.2 10*3/uL — ABNORMAL HIGH (ref 4.0–10.5)
nRBC: 0 % (ref 0.0–0.2)

## 2018-04-03 MED FILL — Verapamil HCl IV Soln 2.5 MG/ML: INTRAVENOUS | Qty: 2 | Status: AC

## 2018-04-03 NOTE — Progress Notes (Signed)
CARDIAC REHAB PHASE I   PRE:  Rate/Rhythm: 76 SR  BP:  Supine: 154/92  Sitting:   Standing:    SaO2:   MODE:  Ambulation: 600 ft   POST:  Rate/Rhythm: 86 SR  BP:  Supine:   Sitting: 165/99  Standing:    SaO2: 98%RA 0905-0955 Pt walked 600 ft with steady gait and no CP. Did c/o shoulder pain which he is seeing orthopedist. Reviewed NTG use, heart healthy diet choices, ex ed and CRP 2. Pt stated he has been on plavix for years. Discussed the importance of plavix with new stents. Referred to [place] CRP 2. Pt stated will not be able to attend due to traveling with work.  Likes to go to gym. Encouraged not to go back to gym until seen by cardiologist.    Luetta Nuttingharlene Ebone Alcivar, RN BSN  04/03/2018 9:49 AM

## 2018-04-03 NOTE — Discharge Summary (Signed)
Discharge Summary    Patient ID: Phillip Mercado MRN: [id number]; DOB: 06/23/1960  Admit date: 04/02/2018 Discharge date: 04/03/2018 Primary Care Provider: Farris Has, MD  Primary Cardiologist: Dr. Allyson Sabal, MD   Discharge Diagnoses    Active Problems:   Coronary artery disease   CAD (coronary artery disease)  Allergies Allergies  Allergen Reactions  . Vytorin [Ezetimibe-Simvastatin] Other (See Comments)    Increased liver enzymes   Diagnostic Studies/Procedures    Cardiac catheterization 04/02/2018:   Previously placed Prox LAD stent (unknown type) is widely patent.  Mid RCA lesion is 95% stenosed.  Dist RCA lesion is 90% stenosed.  A drug-eluting stent was successfully placed.  Post intervention, there is a 0% residual stenosis.  A drug-eluting stent was successfully placed.  Post intervention, there is a 0% residual stenosis.  The left ventricular systolic function is normal.  LV end diastolic pressure is normal.  The left ventricular ejection fraction is 55-65% by visual estimate.   Phillip Mercado is a 58 y.o. male  History of Present Illness     Phillip Mercado is a 58 y.o. male who presented for ongoing assessment and management of CAD s/p PCI to the LAD with DES in 02/2004. He had 30% dominant RCA stenosis. His last Myoview performed 01/08/2010 was nonischemic. He was last seen by Dr. Allyson Sabal on 03/01/2018, and complained of left shoulder pain.   Echocardiogram on 03/09/2018 was normal with Grade 1 diastolic dysfunction. Stress performed 03/14/2018 was abnormal with upsloping ST segment depression of 1 mm noted during stress in V4 and V5 leads. Findings consistent with ischemia.  Found to be intermediate risk study.    He had no relief of left shoulder pain. He continueed to have chest discomfort while walking his dogs. He was concerned that his stent may be blocking up. Given this, he was scheduled for a cath for further evaluation.   Hospital Course   Cath  performed on 04/02/2018 with previously placed prox LAD stent which was widely patent. There was a mid RCA lesion is 95% stenosed and distal RCA lesion is 90% stenosed. Two drug-eluting stents were placed in the mid and distal RCA. LV end diastolic pressure is normal. The left ventricular ejection fraction is 55-65% by visual estimate. No medication changed were made. Pt without chest pain this AM. Right groin cath site unremarkable.   Other hospital problems include:  -Hyperlipidemia:   -Continue simvastatin>>on high dose. Has had issues with other statins   -Will recheck lipid panel in the OP setting   -HTN: -Pt BP mildly elevated above baseline. 149/88>156/95>136/82.Continue to monitor in the OP setting for medication adjustments   -Shoulder pain: -Followed by OP ortho for shoulder concerns. On prednisone dose pack currently. Continue. To be followed by ortho in the OP setting   General: Well developed, well nourished, NAD Skin: Warm, dry, intact  Head: Normocephalic, atraumatic, clear, moist mucus membranes. Neck: Negative for carotid bruits. No JVD Lungs:Clear to ausculation bilaterally. No wheezes, rales, or rhonchi. Breathing is unlabored. Cardiovascular: RRR with S1 S2. No murmurs, rubs, gallops, or LV heave appreciated. Abdomen: Soft, non-tender, non-distended with normoactive bowel sounds. No obvious abdominal masses. MSK: Strength and tone appear normal for age. 5/5 in all extremities Extremities: No edema. No clubbing or cyanosis. DP/PT pulses 2+ bilaterally Neuro: Alert and oriented. No focal deficits. No facial asymmetry. MAE spontaneously. Psych: Responds to questions appropriately with normal affect.    Consultants: None   The patient has been seen and examined by Dr.  Herbie Baltimore who feels that he is stable and ready for discharge today, 04/03/2018. Cath site unremarkable. Pt has ambulated without complication.  _____________ Discharge Vitals Blood pressure (!) 149/88,  pulse 72, temperature 97.8 F (36.6 C), temperature source Oral, resp. rate 17, height 6' (1.829 m), weight 101.4 kg, SpO2 97 %.  Filed Weights   04/02/18 0943 04/03/18 0711  Weight: 103.4 kg 101.4 kg   Labs & Radiologic Studies    CBC Recent Labs    04/03/18 0509  WBC 11.2*  HGB 15.3  HCT 46.4  MCV 88.4  PLT 231   Basic Metabolic Panel Recent Labs    16/10/96 0509  NA 136  K 4.3  CL 105  CO2 24  GLUCOSE 118*  BUN 15  CREATININE 1.12  CALCIUM 8.9   Disposition   Pt is being discharged home today in good condition.  Follow-up Plans & Appointments    Follow-up Information    Jodelle Gross, NP Follow up on 04/11/2018.   Specialties:  Nurse Practitioner, Radiology, Cardiology Why:  Your follow up appointment will be on 04/11/2018 at 0930 Contact information: [address] [place] [address] [phone]          Discharge Instructions    AMB Referral to Cardiac Rehabilitation - Phase II   Complete by:  As directed    Diagnosis:  Coronary Stents   Amb Referral to Cardiac Rehabilitation   Complete by:  As directed    Diagnosis:  Coronary Stents   Call MD for:  difficulty breathing, headache or visual disturbances   Complete by:  As directed    Call MD for:  extreme fatigue   Complete by:  As directed    Call MD for:  hives   Complete by:  As directed    Call MD for:  persistant dizziness or light-headedness   Complete by:  As directed    Call MD for:  persistant nausea and vomiting   Complete by:  As directed    Call MD for:  redness, tenderness, or signs of infection (pain, swelling, redness, odor or green/yellow discharge around incision site)   Complete by:  As directed    Call MD for:  severe uncontrolled pain   Complete by:  As directed    Call MD for:  temperature >100.4   Complete by:  As directed    Diet - low sodium heart healthy   Complete by:  As directed    Discharge instructions   Complete by:  As directed    No  driving for 3 days. No lifting over 5 lbs for 1 week. No sexual activity for 1 week. Keep procedure site clean & dry. If you notice increased pain, swelling, bleeding or pus, call/return!  You may shower, but no soaking baths/hot tubs/pools for 1 week.   PLEASE DO NOT MISS ANY DOSES OF YOUR PLAVIX!!!!! Also keep a log of you blood pressures and bring back to your follow up appt. Please call the office with any questions.   Patients taking blood thinners should generally stay away from medicines like ibuprofen, Advil, Motrin, naproxen, and Aleve due to risk of stomach bleeding. You may take Tylenol as directed or talk to your primary doctor about alternatives.  Some studies suggest Prilosec/Omeprazole interacts with Plavix. If up have reflux symptoms, please use Protonix for less chance of interaction.   Increase activity slowly   Complete by:  As directed      Discharge Medications  Allergies as of 04/03/2018      Reactions   Vytorin [ezetimibe-simvastatin] Other (See Comments)   Increased liver enzymes      Medication List    STOP taking these medications   predniSONE 5 MG (21) Tbpk tablet Commonly known as:  STERAPRED UNI-PAK 21 TAB     TAKE these medications   aspirin EC 81 MG tablet Take 81 mg by mouth at bedtime. Notes to patient:  Blood thinner Prevents clotting in the stent and heart attack   clopidogrel 75 MG tablet Commonly known as:  PLAVIX TAKE 1 TABLET(75 MG) BY MOUTH DAILY What changed:  See the new instructions. Notes to patient:  Blood thinner Prevents clotting in the stent and heart attack   Co Q 10 100 MG Caps Take 100 mg by mouth daily. Notes to patient:  Supplement    multivitamin with minerals Tabs tablet Take 1 tablet by mouth daily. Notes to patient:  Supplement    nitroGLYCERIN 0.4 MG SL tablet Commonly known as:  NITROSTAT Place 1 tablet (0.4 mg total) under the tongue every 5 (five) minutes as needed for chest pain. Notes to patient:  As  needed for chest pain    QUNOL ULTRA COQ10 PO Take 1,000 mg by mouth 3 (three) times a week. LIQUID Notes to patient:  Supplement    simvastatin 80 MG tablet Commonly known as:  ZOCOR TAKE 1 TABLET(80 MG) BY MOUTH DAILY AT 6 PM What changed:  See the new instructions. Notes to patient:  Lowers cholesterol and triglycerides   vitamin C 100 MG tablet Take 100 mg by mouth daily. Notes to patient:  Supplement         Acute coronary syndrome (MI, NSTEMI, STEMI, etc) this admission?: Yes.     AHA/ACC Clinical Performance & Quality Measures: 1. Aspirin prescribed? - Yes 2. ADP Receptor Inhibitor (Plavix/Clopidogrel, Brilinta/Ticagrelor or Effient/Prasugrel) prescribed (includes medically managed patients)? - Yes 3. Beta Blocker prescribed? - Yes 4. High Intensity Statin (Lipitor 40-80mg  or Crestor 20-40mg ) prescribed? - Yes 5. EF assessed during THIS hospitalization? - Yes 6. For EF <40%, was ACEI/ARB prescribed? - Not Applicable (EF >/= 40%) 7. For EF <40%, Aldosterone Antagonist (Spironolactone or Eplerenone) prescribed? - Not Applicable (EF >/= 40%) 8. Cardiac Rehab Phase II ordered (Included Medically managed Patients)? - Yes    Outstanding Labs/Studies   Lipid panel if not followed by PCP   Duration of Discharge Encounter   Greater than 30 minutes including physician time.  Signed, Georgie ChardJill McDaniel, NP 04/03/2018, 11:18 AM

## 2018-04-03 NOTE — Telephone Encounter (Signed)
Per Georgie Chard patient has appt on 04/11/2018 with Joni Reining.

## 2018-04-03 NOTE — Telephone Encounter (Signed)
Discharged Apr 03, 2018 First outreach attempted should be Apr 04, 2018

## 2018-04-04 ENCOUNTER — Ambulatory Visit: Payer: 59 | Admitting: Cardiovascular Disease

## 2018-04-04 NOTE — Telephone Encounter (Signed)
[facility].  Patient understands to follow up with provider LAWRENCE on 04/11/18 at 9:30 [facility]. Patient understands discharge instructions? yes  Patient understands medications and regiment? yes  Patient understands to bring all medications to this visit? yes

## 2018-04-10 NOTE — Progress Notes (Signed)
Cardiology Office Note   Date:  04/11/2018   ID:  Phillip Mercado, DOB 05-11-1960, MRN [id number]  PCP:  Farris Has, MD  Cardiologist: Allyson Sabal  Chief Complaint  Patient presents with  . Coronary Artery Disease  . Hospitalization Follow-up     History of Present Illness: Phillip Mercado is a 58 y.o. male who presents for ongoing assessment and management of CAD, s/p cardiac cath on 04/02/2018 which revealed a 95% mid RCA lesion and a 90% distal RCA lesion. He required a DES reducing the stenosis to 0%. Cath was performed in the setting of abnormal stress test on 03/09/2018, with upsloping ST segment depression of 1 mm noted during stress in V4 and V5 leads. Findings consistent with ischemia.  He has a history of CAD with DES to the LAD in 2005, with 30% dominant RCA.   He comes today without further cardiac complaints. He is very grateful to Dr. Allyson Sabal for his intervention. He continues to have left shoulder pain. He is tolerating all of his medications without issue.   Past Medical History:  Diagnosis Date  . CAD (coronary artery disease) 01/08/2010   R/S MV - EF 58%; normal pattern of perfusion in all regions; no significant wall abnormalities noted; exercise capacity 13 METS, EKG negative for ischemia  . Claudication (HCC) 11/29/2005   bilateral ABIs no evidence of arterial insufficiency; bilateral PVRs, normal values; bilateral lower extremities demonstrate normal values with no suggestion of significant diameter reduction, dissection, aneurysmal dilation or anormality  . Hyperlipidemia   . Hypertension   . Pneumonia    history of pna 3 times     Past Surgical History:  Procedure Laterality Date  . CARDIAC CATHETERIZATION  02/10/2004   mid L anterior descending PCI with 30.13 Cypher stent, resulting in a 90% lesion to a 0% residual  . CARDIAC CATHETERIZATION  06/09/2004   30% stenosis in RCA; widely patent L anterior descending  . CORONARY STENT INTERVENTION N/A 04/02/2018   Procedure:  CORONARY STENT INTERVENTION;  Surgeon: Runell Gess, MD;  Location: [facility] INVASIVE CV LAB;  Service: Cardiovascular;  Laterality: N/A;  . LEFT HEART CATH AND CORONARY ANGIOGRAPHY N/A 04/02/2018   Procedure: LEFT HEART CATH AND CORONARY ANGIOGRAPHY;  Surgeon: Runell Gess, MD;  Location: [facility] INVASIVE CV LAB;  Service: Cardiovascular;  Laterality: N/A;  . TONSILLECTOMY       Current Outpatient Medications  Medication Sig Dispense Refill  . Ascorbic Acid (VITAMIN C) 100 MG tablet Take 100 mg by mouth daily.    Marland Kitchen aspirin EC 81 MG tablet Take 81 mg by mouth at bedtime.     . clopidogrel (PLAVIX) 75 MG tablet TAKE 1 TABLET(75 MG) BY MOUTH DAILY (Patient taking differently: Take 75 mg by mouth at bedtime. ) 30 tablet 5  . Coenzyme Q10 (CO Q 10) 100 MG CAPS Take 100 mg by mouth daily.     . Coenzyme Q10-Vitamin E (QUNOL ULTRA COQ10 PO) Take 1,000 mg by mouth 3 (three) times a week. LIQUID    . Multiple Vitamin (MULTIVITAMIN WITH MINERALS) TABS tablet Take 1 tablet by mouth daily.    . simvastatin (ZOCOR) 80 MG tablet TAKE 1 TABLET(80 MG) BY MOUTH DAILY AT 6 PM (Patient taking differently: Take 80 mg by mouth every evening. ) 30 tablet 11  . nitroGLYCERIN (NITROSTAT) 0.4 MG SL tablet Place 1 tablet (0.4 mg total) under the tongue every 5 (five) minutes as needed for chest pain. (Patient not taking: Reported on 04/11/2018) 90  tablet 3   No current facility-administered medications for this visit.     Allergies:   Vytorin [ezetimibe-simvastatin]    Social History:  The patient  reports that he has never smoked. He has never used smokeless tobacco. He reports that he does not drink alcohol or use drugs.   Family History:  The patient's family history includes Stroke in his paternal grandmother; Sudden death in his paternal grandfather.    ROS: All other systems are reviewed and negative. Unless otherwise mentioned in H&P    PHYSICAL EXAM: VS:  BP 138/86   Pulse 69   Ht 6' (1.829 m)   Wt  225 lb 6.4 oz (102.2 kg)   BMI 30.57 kg/m  , BMI Body mass index is 30.57 kg/m. GEN: Well nourished, well developed, in no acute distress HEENT: normal Neck: no JVD, carotid bruits, or masses Cardiac: RRR; no murmurs, rubs, or gallops,no edema  Respiratory:  Clear to auscultation bilaterally, normal work of breathing GI: soft, nontender, nondistended, + BS MS: no deformity or atrophy. Large amount of ecchymosis distal and proximal to the catheter insertion site. No active bleeding. Skin: warm and dry, no rash Neuro:  Strength and sensation are intact Psych: euthymic mood, full affect   EKG:  NSR rate of 69 bpm.   Recent Labs: 05/23/2017: ALT 26 04/03/2018: BUN 15; Creatinine, Ser 1.12; Hemoglobin 15.3; Platelets 231; Potassium 4.3; Sodium 136    Lipid Panel    Component Value Date/Time   CHOL 164 05/23/2017 0856   TRIG 89 05/23/2017 0856   HDL 49 05/23/2017 0856   CHOLHDL 3.3 05/23/2017 0856   CHOLHDL 3.2 03/29/2016 0847   VLDL 22 03/29/2016 0847   LDLCALC 97 05/23/2017 0856      Wt Readings from Last 3 Encounters:  04/11/18 225 lb 6.4 oz (102.2 kg)  04/03/18 223 lb 8.7 oz (101.4 kg)  03/26/18 229 lb (103.9 kg)      Other studies Reviewed: Cardiac Cath 04/02/2018  Previously placed Prox LAD stent (unknown type) is widely patent.  Mid RCA lesion is 95% stenosed.  Dist RCA lesion is 90% stenosed.  A drug-eluting stent was successfully placed.  Post intervention, there is a 0% residual stenosis.  A drug-eluting stent was successfully placed.  Post intervention, there is a 0% residual stenosis.  The left ventricular systolic function is normal.  LV end diastolic pressure is normal.  The left ventricular ejection fraction is 55-65% by visual estimate.  ASSESSMENT AND PLAN:  1. CAD: Recent cardiac cath with intervention to the mid and distal RCA. He continues on DAPT, BB and statin. He denies any new symptoms. He continues to have left arm pain not felt to  be related to his coronary disease. Continue secondary prevention. He is encouraged to go back to exercise and walking his dog. A copy of his cardiac cath with illustration is provided to the patient with explanation of location of current and previous stent placement.  2. Hypertension:  Well controlled. Continue current regimen  3. Hypercholesterolemia: Continue simvastatin 40 mg daily. Goal of LDL < 70.    Current medicines are reviewed at length with the patient today.    Labs/ tests ordered today include: None  Bettey MareKathryn M. Liborio NixonLawrence DNP, ANP, AACC   04/11/2018 11:06 AM    [facility] [address] Office [phone] Fax [phone]

## 2018-04-11 ENCOUNTER — Ambulatory Visit (INDEPENDENT_AMBULATORY_CARE_PROVIDER_SITE_OTHER): Payer: 59 | Admitting: Adult Health

## 2018-04-11 ENCOUNTER — Encounter (INDEPENDENT_AMBULATORY_CARE_PROVIDER_SITE_OTHER): Payer: Self-pay

## 2018-04-11 ENCOUNTER — Encounter: Payer: Self-pay | Admitting: Adult Health

## 2018-04-11 VITALS — BP 138/86 | HR 69 | Ht 72.0 in | Wt 225.4 lb

## 2018-04-11 DIAGNOSIS — I10 Essential (primary) hypertension: Secondary | ICD-10-CM

## 2018-04-11 DIAGNOSIS — I2510 Atherosclerotic heart disease of native coronary artery without angina pectoris: Secondary | ICD-10-CM | POA: Diagnosis not present

## 2018-04-11 DIAGNOSIS — E7800 Pure hypercholesterolemia, unspecified: Secondary | ICD-10-CM | POA: Diagnosis not present

## 2018-04-11 NOTE — Patient Instructions (Signed)
Follow-Up: You will need a follow up appointment in 2 months.  Please call our office 2 months in advance to schedule this appointment.  You may see Nanetta Batty, MD -ONLY or one of the following Advanced Practice Providers on your designated Care Team:  Corine Shelter, [place]  Judy Pimple, PA-C  Marjie Skiff, [place]       Medication Instructions:  NO CHANGES- Your physician recommends that you continue on your current medications as directed. Please refer to the Current Medication list given to you today. If you need a refill on your cardiac medications before your next appointment, please call your pharmacy. Labwork: When you have labs (blood work) and your tests are completely normal, you will receive your results ONLY by MyChart Message (if you have MyChart) -OR- A paper copy in the mail.  At [facility], you and your health needs are our priority.  As part of our continuing mission to provide you with exceptional heart care, we have created designated Provider Care Teams.  These Care Teams include your primary Cardiologist (physician) and Advanced Practice Providers (APPs -  Physician Assistants and Nurse Practitioners) who all work together to provide you with the care you need, when you need it.  Thank you for choosing [facility] at [facility]!!

## 2018-06-02 ENCOUNTER — Other Ambulatory Visit: Payer: Self-pay | Admitting: Cardiovascular Disease

## 2018-06-04 NOTE — Telephone Encounter (Signed)
RX refill

## 2018-06-08 ENCOUNTER — Telehealth: Payer: Self-pay

## 2018-06-08 NOTE — Telephone Encounter (Signed)
Cardiac Questionnaire:    Since your last visit or hospitalization:    1. Have you been having new or worsening chest pain? NO   2. Have you been having new or worsening shortness of breath? NO 3. Have you been having new or worsening leg swelling, wt gain, or increase in abdominal girth (pants fitting more tightly)? NO   4. Have you had any passing out spells? NO    *A YES to any of these questions would result in the appointment being kept. *If all the answers to these questions are NO, we should indicate that given the current situation regarding the worldwide coronarvirus pandemic, at the recommendation of the CDC, we are looking to limit gatherings in our waiting area, and thus will reschedule their appointment beyond four weeks from today.   _____________    Virtual Visit Pre-Appointment Phone Call  Steps For Call:  1. Confirm consent - "In the setting of the current Covid19 crisis, you are scheduled for a (phone or video) visit with your provider on (date) at (time).  Just as we do with many in-office visits, in order for you to participate in this visit, we must obtain consent.  If you'd like, I can send this to your mychart (if signed up) or email for you to review.  Otherwise, I can obtain your verbal consent now.  All virtual visits are billed to your insurance company just like a normal visit would be.  By agreeing to a virtual visit, we'd like you to understand that the technology does not allow for your provider to perform an examination, and thus may limit your provider's ability to fully assess your condition.  Finally, though the technology is pretty good, we cannot assure that it will always work on either your or our end, and in the setting of a video visit, we may have to convert it to a phone-only visit.  In either situation, we cannot ensure that we have a secure connection.  Are you willing to proceed?"  2. Give patient instructions for WebEx download to smartphone as  below if video visit  3. Advise patient to be prepared with any vital sign or heart rhythm information, their current medicines, and a piece of paper and pen handy for any instructions they may receive the day of their visit  4. Inform patient they will receive a phone call 15 minutes prior to their appointment time (may be from unknown caller ID) so they should be prepared to answer  5. Confirm that appointment type is correct in Epic appointment notes (video vs telephone)    TELEPHONE CALL NOTE  Phillip Mercado has been deemed a candidate for a follow-up tele-health visit to limit community exposure during the Covid-19 pandemic. I spoke with the patient via phone to ensure availability of phone/video source, confirm preferred email & phone number, and discuss instructions and expectations.  I reminded Phillip Mercado to be prepared with any vital sign and/or heart rhythm information that could potentially be obtained via home monitoring, at the time of his visit. I reminded Phillip Mercado to expect a phone call at the time of his visit if his visit.  Did the patient verbally acknowledge consent to treatment? YES  Arnetia Bronk Cottie Banda, RN 06/08/2018    DOWNLOADING THE WEBEX SOFTWARE TO SMARTPHONE  - If Apple, go to App Store and type in WebEx in the search bar. Download [employer], the blue/green circle. The app is free but as with any  other app downloads, their phone may require them to verify saved payment information or Apple password. The patient does NOT have to create an account.  - If Android, ask patient to go to [employer] and type in WebEx in the search bar. Download [employer], the blue/green circle. The app is free but as with any other app downloads, their phone may require them to verify saved payment information or Android password. The patient does NOT have to create an account.   CONSENT FOR TELE-HEALTH VISIT - PLEASE REVIEW  I hereby voluntarily  request, consent and authorize [facility] and its employed or contracted physicians, physician assistants, nurse practitioners or other licensed health care professionals (the Practitioner), to provide me with telemedicine health care services (the "Services") as deemed necessary by the treating Practitioner. I acknowledge and consent to receive the Services by the Practitioner via telemedicine. I understand that the telemedicine visit will involve communicating with the Practitioner through live audiovisual communication technology and the disclosure of certain medical information by electronic transmission. I acknowledge that I have been given the opportunity to request an in-person assessment or other available alternative prior to the telemedicine visit and am voluntarily participating in the telemedicine visit.  I understand that I have the right to withhold or withdraw my consent to the use of telemedicine in the course of my care at any time, without affecting my right to future care or treatment, and that the Practitioner or I may terminate the telemedicine visit at any time. I understand that I have the right to inspect all information obtained and/or recorded in the course of the telemedicine visit and may receive copies of available information for a reasonable fee.  I understand that some of the potential risks of receiving the Services via telemedicine include:  Marland Kitchen Delay or interruption in medical evaluation due to technological equipment failure or disruption; . Information transmitted may not be sufficient (e.g. poor resolution of images) to allow for appropriate medical decision making by the Practitioner; and/or  . In rare instances, security protocols could fail, causing a breach of personal health information.  Furthermore, I acknowledge that it is my responsibility to provide information about my medical history, conditions and care that is complete and accurate to the best of my  ability. I acknowledge that Practitioner's advice, recommendations, and/or decision may be based on factors not within their control, such as incomplete or inaccurate data provided by me or distortions of diagnostic images or specimens that may result from electronic transmissions. I understand that the practice of medicine is not an exact science and that Practitioner makes no warranties or guarantees regarding treatment outcomes. I acknowledge that I will receive a copy of this consent concurrently upon execution via email to the email address I last provided but may also request a printed copy by calling the office of [facility].    I understand that my insurance will be billed for this visit.   I have read or had this consent read to me. . I understand the contents of this consent, which adequately explains the benefits and risks of the Services being provided via telemedicine.  . I have been provided ample opportunity to ask questions regarding this consent and the Services and have had my questions answered to my satisfaction. . I give my informed consent for the services to be provided through the use of telemedicine in my medical care  By participating in this telemedicine visit I agree to the  above.

## 2018-06-12 ENCOUNTER — Telehealth: Payer: Self-pay

## 2018-06-12 ENCOUNTER — Telehealth (INDEPENDENT_AMBULATORY_CARE_PROVIDER_SITE_OTHER): Payer: 59 | Admitting: Cardiovascular Disease

## 2018-06-12 DIAGNOSIS — I2510 Atherosclerotic heart disease of native coronary artery without angina pectoris: Secondary | ICD-10-CM

## 2018-06-12 DIAGNOSIS — E7800 Pure hypercholesterolemia, unspecified: Secondary | ICD-10-CM | POA: Diagnosis not present

## 2018-06-12 NOTE — Progress Notes (Signed)
Virtual Visit via Video Note   This visit type was conducted due to national recommendations for restrictions regarding the COVID-19 Pandemic (e.g. social distancing) in an effort to limit this patient's exposure and mitigate transmission in our community.  Due to his co-morbid illnesses, this patient is at least at moderate risk for complications without adequate follow up.  This format is felt to be most appropriate for this patient at this time.  All issues noted in this document were discussed and addressed.  A limited physical exam was performed with this format.  Please refer to the patient's chart for his consent to telehealth for [facility].   Evaluation Performed:  Follow-up visit  Date:  06/12/2018   ID:  Phillip Mercado, DOB Jul 12, 1960, MRN [id number]  Patient Location: Home  Provider Location: Home  PCP:  Farris Has, MD  Cardiologist:  Nanetta Batty, MD  Electrophysiologist:  None   Chief Complaint: Follow-up of his recent PCI and stent procedure  History of Present Illness:    Phillip Mercado is a 58 y.o. male who presents via audio/video conferencing for a telehealth visit today.    Phillip Mercado is a 58y.o.  mild to moderately overweight married Caucasian male father of 5 children, 2 daughters and grandchildren who I last saw in the office 03/01/2018. He has a history of CAD status post LAD PCI and stenting with a Cypher drug-eluting stent of December 2005. He had 30% dominant RCA stenosis and normal in function. His other problems include hypertension and hyperlipidemia.  Because of new onset chest discomfort and fatigue while walking his dog I did get a stress test on him 03/14/2018 which was abnormal.  This led to a heart cath 04/02/2018 with right femoral approach revealing a patent proximal LAD stent placed in 2005, high-grade mid and distal dominant RCA stenoses, and normal LV function.  I placed 2 drug-eluting stents in his RCA.  He was discharged home the following  day.  He has had no recurrent symptoms other than what sounds like left upper extremity neuropathic pain.  He did see Joni Reining, NP in the office and was doing well.  The patient does not have symptoms concerning for COVID-19 infection (fever, chills, cough, or new shortness of breath).    Past Medical History:  Diagnosis Date   CAD (coronary artery disease) 01/08/2010   R/S MV - EF 58%; normal pattern of perfusion in all regions; no significant wall abnormalities noted; exercise capacity 13 METS, EKG negative for ischemia   Claudication (HCC) 11/29/2005   bilateral ABIs no evidence of arterial insufficiency; bilateral PVRs, normal values; bilateral lower extremities demonstrate normal values with no suggestion of significant diameter reduction, dissection, aneurysmal dilation or anormality   Hyperlipidemia    Hypertension    Pneumonia    history of pna 3 times    Past Surgical History:  Procedure Laterality Date   CARDIAC CATHETERIZATION  02/10/2004   mid L anterior descending PCI with 30.13 Cypher stent, resulting in a 90% lesion to a 0% residual   CARDIAC CATHETERIZATION  06/09/2004   30% stenosis in RCA; widely patent L anterior descending   CORONARY STENT INTERVENTION N/A 04/02/2018   Procedure: CORONARY STENT INTERVENTION;  Surgeon: Runell Gess, MD;  Location: [facility] INVASIVE CV LAB;  Service: Cardiovascular;  Laterality: N/A;   LEFT HEART CATH AND CORONARY ANGIOGRAPHY N/A 04/02/2018   Procedure: LEFT HEART CATH AND CORONARY ANGIOGRAPHY;  Surgeon: Runell Gess, MD;  Location: [facility] INVASIVE CV LAB;  Service: Cardiovascular;  Laterality: N/A;   TONSILLECTOMY       Current Meds  Medication Sig   Ascorbic Acid (VITAMIN C) 100 MG tablet Take 100 mg by mouth daily.   aspirin EC 81 MG tablet Take 81 mg by mouth at bedtime.    clopidogrel (PLAVIX) 75 MG tablet TAKE 1 TABLET(75 MG) BY MOUTH DAILY (Patient taking differently: Take 75 mg by mouth at bedtime. )    Coenzyme Q10 (CO Q 10) 100 MG CAPS Take 100 mg by mouth daily.    Coenzyme Q10-Vitamin E (QUNOL ULTRA COQ10 PO) Take 1,000 mg by mouth 3 (three) times a week. LIQUID   Multiple Vitamin (MULTIVITAMIN WITH MINERALS) TABS tablet Take 1 tablet by mouth daily.   simvastatin (ZOCOR) 80 MG tablet TAKE 1 TABLET(80 MG) BY MOUTH DAILY AT 6 PM     Allergies:   Crestor [rosuvastatin calcium] and Vytorin [ezetimibe-simvastatin]   Social History   Tobacco Use   Smoking status: Never Smoker   Smokeless tobacco: Never Used  Substance Use Topics   Alcohol use: No   Drug use: No     Family Hx: The patient's family history includes Stroke in his paternal grandmother; Sudden death in his paternal grandfather.  ROS:   Please see the history of present illness.     All other systems reviewed and are negative.   Prior CV studies:   The following studies were reviewed today:  Cardiac catheterization/PCI and stent  Labs/Other Tests and Data Reviewed:    EKG:  No ECG reviewed.  Recent Labs: 04/03/2018: BUN 15; Creatinine, Ser 1.12; Hemoglobin 15.3; Platelets 231; Potassium 4.3; Sodium 136   Recent Lipid Panel Lab Results  Component Value Date/Time   CHOL 164 05/23/2017 08:56 AM   TRIG 89 05/23/2017 08:56 AM   HDL 49 05/23/2017 08:56 AM   CHOLHDL 3.3 05/23/2017 08:56 AM   CHOLHDL 3.2 03/29/2016 08:47 AM   LDLCALC 97 05/23/2017 08:56 AM    Wt Readings from Last 3 Encounters:  04/11/18 225 lb 6.4 oz (102.2 kg)  04/03/18 223 lb 8.7 oz (101.4 kg)  03/26/18 229 lb (103.9 kg)     Objective:    Vital Signs:  There were no vitals taken for this visit.   Well nourished, well developed male in no acute distress. A full physical exam was not performed since this was a video virtual visit  ASSESSMENT & PLAN:    1. Coronary artery disease- history of CAD status post PCI and Cypher drug-eluting stenting of his proximal LAD by myself December 2005.  He had a 30% dominant RCA stenosis and  normal LV function at that time.  He did have a Myoview stress test performed 01/08/2010 that was nonischemic.  Because of new onset chest pain he underwent stress testing 03/14/2018 that was abnormal which led to a cardiac catheterization 04/02/2018 revealing a patent proximal LAD stent, high-grade mid and distal RCA stenoses both of which were stented with synergy drug-eluting stents.  He was discharged the following day the symptoms have markedly improved.  He remains on dual antiplatelet therapy including aspirin and clopidogrel. 2. Hyperlipidemia- his last lipid profile performed 05/23/2017 revealed an LDL of 97 and HDL 49 on statin therapy 3. History of essential hypertension not on antihypertensive medications.  His blood pressure was not assessed today.  COVID-19 Education: The signs and symptoms of COVID-19 were discussed with the patient and how to seek care for testing (follow up with PCP or arrange E-visit).  The importance of social distancing was discussed today.  Time:   Today, I have spent 20 minutes with the patient with telehealth technology discussing the above problems.     Medication Adjustments/Labs and Tests Ordered: Current medicines are reviewed at length with the patient today.  Concerns regarding medicines are outlined above.  Tests Ordered: No orders of the defined types were placed in this encounter.  Medication Changes: No orders of the defined types were placed in this encounter.   Disposition:  Follow up in 6 month(s)  Signed, Nanetta BattyJonathan Corry Ihnen, MD  06/12/2018 8:42 AM    [facility]

## 2018-06-12 NOTE — Patient Instructions (Signed)
Medication Instructions:  Your physician recommends that you continue on your current medications as directed. Please refer to the Current Medication list given to you today.  If you need a refill on your cardiac medications before your next appointment, please call your pharmacy.   Lab work: Your physician recommends that you return for lab work in: 6 months (fasting lipid profile). Please present for alb work before your 6 month follow-up appointment with Dr. Allyson Sabal  If you have labs (blood work) drawn today and your tests are completely normal, you will receive your results only by: Marland Kitchen MyChart Message (if you have MyChart) OR . A paper copy in the mail If you have any lab test that is abnormal or we need to change your treatment, we will call you to review the results.  Testing/Procedures: NONE  Follow-Up: At [facility], you and your health needs are our priority.  As part of our continuing mission to provide you with exceptional heart care, we have created designated Provider Care Teams.  These Care Teams include your primary Cardiologist (physician) and Advanced Practice Providers (APPs -  Physician Assistants and Nurse Practitioners) who all work together to provide you with the care you need, when you need it. You will need a follow up appointment in 6 months with Dr. Allyson Sabal.  Please call our office 2 months in advance to schedule this appointment. Please present for lab work in our office before your appointment.

## 2018-06-12 NOTE — Telephone Encounter (Signed)
Patient and/or DPR-approved person aware of AVS instructions and verbalized understanding. 

## 2018-07-05 ENCOUNTER — Other Ambulatory Visit: Payer: Self-pay | Admitting: Cardiovascular Disease

## 2018-07-05 NOTE — Telephone Encounter (Signed)
Clopidogrel refilled

## 2019-03-11 ENCOUNTER — Telehealth: Payer: Self-pay | Admitting: *Deleted

## 2019-03-11 NOTE — Telephone Encounter (Signed)
   [facility] Pre-operative Risk Assessment    Request for surgical clearance:  1. What type of surgery is being performed? COLONOSCOPY  2. When is this surgery scheduled?  END OF JAN OR BEGINNING OF FEB   3. What type of clearance is required (medical clearance vs. Pharmacy clearance to hold med vs. Both)? BOTH  4. Are there any medications that need to be held prior to surgery and how long? PLAVIX HOLD 5 DAYS PRIOR   5. Practice name and name of physician performing surgery? [facility]   6. What is your office phone number [phone]    7.   What is your office fax number [phone]  8.   Anesthesia type (None, local, MAC, general) ? PROPOFOL   Devra Dopp 03/11/2019, 4:44 PM  _________________________________________________________________   (provider comments below)

## 2019-03-12 NOTE — Telephone Encounter (Signed)
Primary Cardiologist:Jonathan Allyson Sabal, MD  Chart reviewed as part of pre-operative protocol coverage. Because of Lyndol Vanderheiden past medical history and time since last visit, he/she will require a follow-up visit in order to better assess preoperative cardiovascular risk.  Pre-op covering staff: - Please schedule appointment and call patient to inform them. - Please contact requesting surgeon's office via preferred method (i.e, phone, fax) to inform them of need for appointment prior to surgery.  If applicable, this message will also be routed to pharmacy pool and/or primary cardiologist for input on holding anticoagulant/antiplatelet agent as requested below so that this information is available at time of patient's appointment.   Joni Reining, NP  03/12/2019, 9:04 AM

## 2019-03-12 NOTE — Telephone Encounter (Signed)
Leave message for pt to call back to schedule appointment

## 2019-03-13 NOTE — Telephone Encounter (Signed)
I called the pt to schedule and appt for pre op clearance. Pt says to me "what took so long". He states to me that he s/w GI in October about his procedure. I explained to the pt that our office never received a clearance until 03/11/19 which was entered into his chart at that time. Pt states he is at work and he will need to call back to make appt. I advised him to please call the office and let the operator know that he needs an appt for pre op clearance with Dr. Allyson Sabal or his care team. Pt then hung up on me.

## 2019-03-20 NOTE — Telephone Encounter (Signed)
Left a voice message for the patient stating that I was calling to get him scheduled for a pre op clearance appointment and to give our office a call back to get schedule or someone from our scheduling department will give him a call to get him scheduled for the appointment.

## 2019-03-26 ENCOUNTER — Encounter: Payer: Self-pay | Admitting: Cardiovascular Disease

## 2019-03-26 ENCOUNTER — Other Ambulatory Visit: Payer: Self-pay

## 2019-03-26 ENCOUNTER — Ambulatory Visit (INDEPENDENT_AMBULATORY_CARE_PROVIDER_SITE_OTHER): Payer: 59 | Admitting: Cardiovascular Disease

## 2019-03-26 VITALS — BP 124/80 | HR 77 | Temp 98.3°F | Ht 72.0 in | Wt 235.0 lb

## 2019-03-26 DIAGNOSIS — Z79899 Other long term (current) drug therapy: Secondary | ICD-10-CM

## 2019-03-26 DIAGNOSIS — I25110 Atherosclerotic heart disease of native coronary artery with unstable angina pectoris: Secondary | ICD-10-CM

## 2019-03-26 DIAGNOSIS — E782 Mixed hyperlipidemia: Secondary | ICD-10-CM

## 2019-03-26 DIAGNOSIS — R9430 Abnormal result of cardiovascular function study, unspecified: Secondary | ICD-10-CM | POA: Diagnosis not present

## 2019-03-26 DIAGNOSIS — I10 Essential (primary) hypertension: Secondary | ICD-10-CM

## 2019-03-26 NOTE — Assessment & Plan Note (Signed)
History of CAD status post LAD PCI and stenting using a Cypher drug-eluting stent by myself of his proximal LAD December 2005.  Because of chest pain and an abnormal stress test I cathed him 04/02/2018 from the right femoral approach revealing a patent proximal LAD stent and high-grade mid and distal RCA stenosis which I stented using 2 overlapping drug-eluting stents.  LV function was normal.  Discharged home the following day.  Is been asymptomatic since.

## 2019-03-26 NOTE — Patient Instructions (Addendum)
Medication Instructions:  Start taking Repatha 140mg  every 14 days.  If you need a refill on your cardiac medications before your next appointment, please call your pharmacy.   Lab work: Get fasting lipid and hepatic function in 3 months  Testing/Procedures: NONE  Follow-Up: At , you and your health needs are our priority.  As part of our continuing mission to provide you with exceptional heart care, we have created designated Provider Care Teams.  These Care Teams include your primary Cardiologist (physician) and Advanced Practice Providers (APPs -  Physician Assistants and Nurse Practitioners) who all work together to provide you with the care you need, when you need it. You may see [employer], MD or one of the following Advanced Practice Providers on your designated Care Team:    Nanetta Batty, PA-C  [place], [place]  [place], Edd Fabian  Your physician wants you to follow-up in: 1 year with Dr. [place]

## 2019-03-26 NOTE — Assessment & Plan Note (Signed)
History of hyperlipidemia on 80 of simvastatin with lipid profile performed 11/05/2018 revealing total cholesterol of 182, LDL of 118 and HDL 47.  He is intolerant to other statin drugs.  I believe he would benefit from Repatha.

## 2019-03-26 NOTE — Progress Notes (Signed)
03/26/2019 Vista Deck   [birth year]  [id number]  Primary Physician Farris Has, MD Primary Cardiologist: Runell Gess MD Nicholes Calamity, [place]  HPI:  Phillip Mercado is a 59 y.o.  mild to moderately overweight married Caucasian male father of 5 children, 2 daughters and grandchildren who I last saw i virtually via video telemedicine visit 06/12/2018.Marland Kitchen He has a history of CAD status post LAD PCI and stenting with a Cypher drug-eluting stent of December 2005. He had 30% dominant RCA stenosis and normal in function. His other problems include hypertension and hyperlipidemia.  Because of new onset chest discomfort and fatigue while walking his dog I did get a stress test on him 03/14/2018 which was abnormal.  This led to a heart cath 04/02/2018 with right femoral approach revealing a patent proximal LAD stent placed in 2005, high-grade mid and distal dominant RCA stenoses, and normal LV function.  I placed 2 drug-eluting stents in his RCA.  He was discharged home the following day.  He has had no recurrent symptoms other than what sounds like left upper extremity neuropathic pain.    Since I saw him virtually 8 months ago he continues to do well.  He has gained a few pounds.  He denies chest pain or shortness of breath.  Current Meds  Medication Sig  . Apoaequorin (PREVAGEN PO) Take 1 tablet by mouth daily.  . Ascorbic Acid (VITAMIN C) 100 MG tablet Take 100 mg by mouth daily.  Marland Kitchen aspirin EC 81 MG tablet Take 81 mg by mouth at bedtime.   . clopidogrel (PLAVIX) 75 MG tablet TAKE 1 TABLET BY MOUTH EVERY DAY  . Coenzyme Q10 (CO Q 10) 100 MG CAPS Take 100 mg by mouth daily.   . Coenzyme Q10-Vitamin E (QUNOL ULTRA COQ10 PO) Take 1,000 mg by mouth 3 (three) times a week. LIQUID  . Multiple Vitamin (MULTIVITAMIN WITH MINERALS) TABS tablet Take 1 tablet by mouth daily.  . simvastatin (ZOCOR) 80 MG tablet TAKE 1 TABLET(80 MG) BY MOUTH DAILY AT 6 PM     Allergies  Allergen Reactions  . Crestor  [Rosuvastatin Calcium]     Muscle pains  . Vytorin [Ezetimibe-Simvastatin] Other (See Comments)    Increased liver enzymes    Social History   Socioeconomic History  . Marital status: Married    Spouse name: Not on file  . Number of children: Not on file  . Years of education: Not on file  . Highest education level: Not on file  Occupational History  . Not on file  Tobacco Use  . Smoking status: Never Smoker  . Smokeless tobacco: Never Used  Substance and Sexual Activity  . Alcohol use: No  . Drug use: No  . Sexual activity: Not on file  Other Topics Concern  . Not on file  Social History Narrative  . Not on file   Social Determinants of Health   Financial Resource Strain:   . Difficulty of Paying Living Expenses: Not on file  Food Insecurity:   . Worried About Programme researcher, broadcasting/film/video in the Last Year: Not on file  . Ran Out of Food in the Last Year: Not on file  Transportation Needs:   . Lack of Transportation (Medical): Not on file  . Lack of Transportation (Non-Medical): Not on file  Physical Activity:   . Days of Exercise per Week: Not on file  . Minutes of Exercise per Session: Not on file  Stress:   .  Feeling of Stress : Not on file  Social Connections:   . Frequency of Communication with Friends and Family: Not on file  . Frequency of Social Gatherings with Friends and Family: Not on file  . Attends Religious Services: Not on file  . Active Member of Clubs or Organizations: Not on file  . Attends Archivist Meetings: Not on file  . Marital Status: Not on file  Intimate Partner Violence:   . Fear of Current or Ex-Partner: Not on file  . Emotionally Abused: Not on file  . Physically Abused: Not on file  . Sexually Abused: Not on file     Review of Systems: General: negative for chills, fever, night sweats or weight changes.  Cardiovascular: negative for chest pain, dyspnea on exertion, edema, orthopnea, palpitations, paroxysmal nocturnal dyspnea  or shortness of breath Dermatological: negative for rash Respiratory: negative for cough or wheezing Urologic: negative for hematuria Abdominal: negative for nausea, vomiting, diarrhea, bright red blood per rectum, melena, or hematemesis Neurologic: negative for visual changes, syncope, or dizziness All other systems reviewed and are otherwise negative except as noted above.    Blood pressure 124/80, pulse 77, temperature 98.3 F (36.8 C), height 6' (1.829 m), weight 235 lb (106.6 kg).  General appearance: alert and no distress Neck: no adenopathy, no carotid bruit, no JVD, supple, symmetrical, trachea midline and thyroid not enlarged, symmetric, no tenderness/mass/nodules Lungs: clear to auscultation bilaterally Heart: regular rate and rhythm, S1, S2 normal, no murmur, click, rub or gallop Extremities: extremities normal, atraumatic, no cyanosis or edema Pulses: 2+ and symmetric Skin: Skin color, texture, turgor normal. No rashes or lesions Neurologic: Alert and oriented X 3, normal strength and tone. Normal symmetric reflexes. Normal coordination and gait  EKG sinus rhythm at 77 without ST or T wave changes.  Personally reviewed this EKG.  ASSESSMENT AND PLAN:   Coronary artery disease History of CAD status post LAD PCI and stenting using a Cypher drug-eluting stent by myself of his proximal LAD December 2005.  Because of chest pain and an abnormal stress test I cathed him 04/02/2018 from the right femoral approach revealing a patent proximal LAD stent and high-grade mid and distal RCA stenosis which I stented using 2 overlapping drug-eluting stents.  LV function was normal.  Discharged home the following day.  Is been asymptomatic since.  Essential hypertension Patient essential potential blood pressure measured today 124/80.  He is not on antihypertensive medications.  Hyperlipidemia History of hyperlipidemia on 80 of simvastatin with lipid profile performed 11/05/2018 revealing  total cholesterol of 182, LDL of 118 and HDL 47.  He is intolerant to other statin drugs.  I believe he would benefit from [place].      Lorretta Harp MD FACP,FACC,FAHA, [facility] 03/26/2019 3:41 PM

## 2019-03-26 NOTE — Assessment & Plan Note (Signed)
Patient essential potential blood pressure measured today 124/80.  He is not on antihypertensive medications.

## 2019-04-01 ENCOUNTER — Telehealth: Payer: Self-pay

## 2019-04-01 MED ORDER — REPATHA SURECLICK 140 MG/ML ~~LOC~~ SOAJ: 140.0000 mg | SUBCUTANEOUS | 11 refills | Status: DC

## 2019-04-01 NOTE — Telephone Encounter (Signed)
Called and spoke with pt about starting the repatha. They stated that they already had a copay card and were good to go

## 2019-04-08 ENCOUNTER — Telehealth: Payer: Self-pay

## 2019-04-08 NOTE — Telephone Encounter (Signed)
   [facility] Pre-operative Risk Assessment    Request for surgical clearance:  1. What type of surgery is being performed? COLONOSCOPY   2. When is this surgery scheduled? DATE NOT SPECIFIED; FEBRUARY   3. What type of clearance is required (medical clearance vs. Pharmacy clearance to hold med vs. Both)? PHARMACY CLEARANCE  4. Are there any medications that need to be held prior to surgery and how long? PLAVIX; 5-DAY HOLD   5. Practice name and name of physician performing surgery? [facility];  DR. Alessandra Bevels  6. What is your office phone number [phone]    7.   What is your office fax number [phone]  8.   Anesthesia type (None, local, MAC, general) ? PROPOFOL   Aniyiah Zell O Warden Buffa 04/08/2019, 12:41 PM  _________________________________________________________________   (provider comments below)

## 2019-04-08 NOTE — Telephone Encounter (Signed)
Hi. Dr. Allyson Sabal,  Phillip Mercado has upcoming colonoscopy planned sometime this month. He has a history of CAD s/p DES x2 to RCA in 03/2018. You recently saw him on 03/26/2019 and hew as doing well at that time. It looks like he OK for this low risk procedure. Is it OK for him to hold Plavix for 5 days?  Please route response back to P CV DIV PREOP.  Thank you! [name]

## 2019-04-09 NOTE — Telephone Encounter (Signed)
Okay to hold antiplatelet drugs for his procedure.

## 2019-04-09 NOTE — Telephone Encounter (Signed)
   Primary Cardiologist: Nanetta Batty, MD  Chart reviewed as part of pre-operative protocol coverage.He as last seen by Dr. Allyson Sabal on 03/26/2019 for management of CAD, with PCI of the LAD in 02/2004 and DES to the RCA 04/02/2018. Marland KitchenGiven past medical history and time since last visit, based on ACC/AHA guidelines, Yuji Walth would be at acceptable risk for the planned procedure without further cardiovascular testing. He may hold Plavix for 5 day prior to the colonoscopy.   Dr. Allyson Sabal has also reviewed and made the above recommendations on 04/08/2019. I have spoken with the patient about this and he verbalizes understanding. GI will notify him of the date of planned procedure for timing of holding Plavix.   I will route this recommendation to the requesting party via Epic fax function and remove from pre-op pool.  Please call with questions.  Bettey Mare. Liborio Nixon, ANP, AACC  04/09/2019, 8:17 AM

## 2019-06-02 ENCOUNTER — Other Ambulatory Visit: Payer: Self-pay | Admitting: Cardiovascular Disease

## 2019-07-09 ENCOUNTER — Other Ambulatory Visit: Payer: Self-pay | Admitting: Cardiovascular Disease

## 2019-08-19 ENCOUNTER — Telehealth: Payer: Self-pay

## 2019-08-19 NOTE — Telephone Encounter (Signed)
Called and spoke w/pt regarding the denial and need for labs. Pt stated that they will come and have it completed asap

## 2019-08-20 LAB — HEPATIC FUNCTION PANEL
ALT: 29 IU/L (ref 0–44)
AST: 36 IU/L (ref 0–40)
Albumin: 4.3 g/dL (ref 3.8–4.9)
Alkaline Phosphatase: 96 IU/L (ref 48–121)
Bilirubin Total: 0.5 mg/dL (ref 0.0–1.2)
Bilirubin, Direct: 0.14 mg/dL (ref 0.00–0.40)
Total Protein: 6.8 g/dL (ref 6.0–8.5)

## 2019-08-20 LAB — LIPID PANEL
Chol/HDL Ratio: 3.2 ratio (ref 0.0–5.0)
Cholesterol, Total: 149 mg/dL (ref 100–199)
HDL: 47 mg/dL (ref >39)
LDL Chol Calc (NIH): 78 mg/dL (ref 0–99)
Triglycerides: 134 mg/dL (ref 0–149)
VLDL Cholesterol Cal: 24 mg/dL (ref 5–40)

## 2020-04-10 ENCOUNTER — Telehealth: Payer: Self-pay | Admitting: Cardiovascular Disease

## 2020-04-10 DIAGNOSIS — I25110 Atherosclerotic heart disease of native coronary artery with unstable angina pectoris: Secondary | ICD-10-CM

## 2020-04-10 DIAGNOSIS — E782 Mixed hyperlipidemia: Secondary | ICD-10-CM

## 2020-04-10 NOTE — Telephone Encounter (Signed)
Spoke with pt, Lab orders mailed to the pt and Follow up scheduled

## 2020-04-10 NOTE — Telephone Encounter (Signed)
    Pt said he needs to get blood work before schedule f/u with Dr. Allyson Sabal. No order on file

## 2020-06-01 LAB — HEPATIC FUNCTION PANEL
ALT: 25 IU/L (ref 0–44)
AST: 30 IU/L (ref 0–40)
Albumin: 4.3 g/dL (ref 3.8–4.9)
Alkaline Phosphatase: 110 IU/L (ref 44–121)
Bilirubin Total: 0.5 mg/dL (ref 0.0–1.2)
Bilirubin, Direct: 0.12 mg/dL (ref 0.00–0.40)
Total Protein: 6.8 g/dL (ref 6.0–8.5)

## 2020-06-01 LAB — BASIC METABOLIC PANEL
BUN/Creatinine Ratio: 12 (ref 10–24)
BUN: 16 mg/dL (ref 8–27)
CO2: 22 mmol/L (ref 20–29)
Calcium: 9.2 mg/dL (ref 8.6–10.2)
Chloride: 106 mmol/L (ref 96–106)
Creatinine, Ser: 1.3 mg/dL — ABNORMAL HIGH (ref 0.76–1.27)
Glucose: 94 mg/dL (ref 65–99)
Potassium: 4.7 mmol/L (ref 3.5–5.2)
Sodium: 141 mmol/L (ref 134–144)
eGFR: 63 mL/min/{1.73_m2} (ref >59)

## 2020-06-01 LAB — CBC
Hematocrit: 44.7 % (ref 37.5–51.0)
Hemoglobin: 14.8 g/dL (ref 13.0–17.7)
MCH: 29.7 pg (ref 26.6–33.0)
MCHC: 33.1 g/dL (ref 31.5–35.7)
MCV: 90 fL (ref 79–97)
Platelets: 201 10*3/uL (ref 150–450)
RBC: 4.98 x10E6/uL (ref 4.14–5.80)
RDW: 13.6 % (ref 11.6–15.4)
WBC: 3.6 10*3/uL (ref 3.4–10.8)

## 2020-06-01 LAB — LIPID PANEL
Chol/HDL Ratio: 4 ratio (ref 0.0–5.0)
Cholesterol, Total: 174 mg/dL (ref 100–199)
HDL: 44 mg/dL (ref >39)
LDL Chol Calc (NIH): 108 mg/dL — ABNORMAL HIGH (ref 0–99)
Triglycerides: 121 mg/dL (ref 0–149)
VLDL Cholesterol Cal: 22 mg/dL (ref 5–40)

## 2020-06-03 ENCOUNTER — Ambulatory Visit (INDEPENDENT_AMBULATORY_CARE_PROVIDER_SITE_OTHER): Payer: No Typology Code available for payment source | Admitting: Cardiovascular Disease

## 2020-06-03 ENCOUNTER — Other Ambulatory Visit: Payer: Self-pay

## 2020-06-03 ENCOUNTER — Encounter: Payer: Self-pay | Admitting: Cardiovascular Disease

## 2020-06-03 VITALS — BP 142/84 | HR 67 | Ht 72.0 in | Wt 239.0 lb

## 2020-06-03 DIAGNOSIS — E782 Mixed hyperlipidemia: Secondary | ICD-10-CM | POA: Diagnosis not present

## 2020-06-03 DIAGNOSIS — I10 Essential (primary) hypertension: Secondary | ICD-10-CM

## 2020-06-03 DIAGNOSIS — I2510 Atherosclerotic heart disease of native coronary artery without angina pectoris: Secondary | ICD-10-CM

## 2020-06-03 MED ORDER — EZETIMIBE 10 MG PO TABS: 10.0000 mg | ORAL_TABLET | Freq: Every day | ORAL | 3 refills | Status: DC

## 2020-06-03 NOTE — Patient Instructions (Signed)
Medication Instructions:   -Start taking ezetimibe (zetia) 10mg  once daily.  *If you need a refill on your cardiac medications before your next appointment, please call your pharmacy*   Lab Work: Your physician recommends that you return for lab work in: 2 months to check lipid/liver profile.  If you have labs (blood work) drawn today and your tests are completely normal, you will receive your results only by: MyChart Message (if you have MyChart) OR . A paper copy in the mail If you have any lab test that is abnormal or we need to change your treatment, we will call you to review the results.   Follow-Up: At [facility], you and your health needs are our priority.  As part of our continuing mission to provide you with exceptional heart care, we have created designated Provider Care Teams.  These Care Teams include your primary Cardiologist (physician) and Advanced Practice Providers (APPs -  Physician Assistants and Nurse Practitioners) who all work together to provide you with the care you need, when you need it.  We recommend signing up for the patient portal called "MyChart".  Sign up information is provided on this After Visit Summary.  MyChart is used to connect with patients for Virtual Visits (Telemedicine).  Patients are able to view lab/test results, encounter notes, upcoming appointments, etc.  Non-urgent messages can be sent to your provider as well.   To learn more about what you can do with MyChart, go to [facility].    Your next appointment:   12 month(s)  The format for your next appointment:   In Person  Provider:   [email or web address], MD

## 2020-06-03 NOTE — Assessment & Plan Note (Signed)
History of CAD status post LAD PCI and stenting using Cypher drug-eluting stent by myself in December 2005.  He had 30% dominant RCA stenosis and normal LV function at that time.  Because of new onset fatigue and chest discomfort I obtain a stress test on him 03/14/2018 which was abnormal.  This led to heart cath 04/02/2018 via the right femoral approach revealing a patent proximal LAD stent and high-grade mid and distal RCA stenosis both of which were stented with drug-eluting stents.  He has had no recurrent symptoms.  He remains on dual antiplatelet therapy.

## 2020-06-03 NOTE — Progress Notes (Signed)
06/03/2020 Vista Deck   08-01-1960  [id number]  Primary Physician Farris Has, MD Primary Cardiologist: Runell Gess MD FACP, [place], [place], [place]  HPI:  Phillip Mercado is a 60 y.o.   mild to moderately overweight married Ca in the office 03/26/2019.  One of his sons recently started medical school getting his DO degree.  He has a history of CAD status post LAD PCI and stenting with a Cypher drug-eluting stent of December 2005. He had 30% dominant RCA stenosis and normal in function. His other problems include hypertension and hyperlipidemia.  Because of new onset chest discomfort and fatigue while walking his dog I did get a stress test on him 03/14/2018 which was abnormal. This led to a heart cath 04/02/2018 with right femoral approach revealing a patent proximal LAD stent placed in 2005, high-grade mid and distal dominant RCA stenoses, and normal LV function. I placed 2 drug-eluting stents in his RCA. He was discharged home the following day. He has had no recurrent symptoms other than what sounds like left upper extremity neuropathic pain.   Since I saw him a year ago he continues to do well.  He is not as active as he used to be.  He denies chest pain or shortness of breath.  His recent lipid profile revealed an increase in his LDL from 78 up to 108 probably because he stopped his Repatha..    Current Meds  Medication Sig  . Ascorbic Acid (VITAMIN C) 100 MG tablet Take 100 mg by mouth daily.  Marland Kitchen aspirin EC 81 MG tablet Take 81 mg by mouth at bedtime.   . Cholecalciferol (VITAMIN D-3 PO) Take 1 capsule by mouth daily.  . clopidogrel (PLAVIX) 75 MG tablet TAKE 1 TABLET BY MOUTH EVERY DAY  . Coenzyme Q10 (CO Q 10) 100 MG CAPS Take 100 mg by mouth daily.   . Coenzyme Q10-Vitamin E (QUNOL ULTRA COQ10 PO) Take 1,000 mg by mouth 3 (three) times a week. LIQUID  . ezetimibe (ZETIA) 10 MG tablet Take 1 tablet (10 mg total) by mouth daily.  . Multiple Vitamin (MULTIVITAMIN WITH  MINERALS) TABS tablet Take 1 tablet by mouth daily.  . nitroGLYCERIN (NITROSTAT) 0.4 MG SL tablet Place 1 tablet (0.4 mg total) under the tongue every 5 (five) minutes as needed for chest pain.  . simvastatin (ZOCOR) 80 MG tablet TAKE 1 TABLET(80 MG) BY MOUTH DAILY AT 6 PM  . [DISCONTINUED] Apoaequorin (PREVAGEN PO) Take 1 tablet by mouth daily.  . [DISCONTINUED] Evolocumab (REPATHA SURECLICK) 140 MG/ML SOAJ Inject 140 mg into the skin every 14 (fourteen) days.     Allergies  Allergen Reactions  . Crestor [Rosuvastatin Calcium]     Muscle pains  . Vytorin [Ezetimibe-Simvastatin] Other (See Comments)    Increased liver enzymes    Social History   Socioeconomic History  . Marital status: Married    Spouse name: Not on file  . Number of children: Not on file  . Years of education: Not on file  . Highest education level: Not on file  Occupational History  . Not on file  Tobacco Use  . Smoking status: Never Smoker  . Smokeless tobacco: Never Used  Vaping Use  . Vaping Use: Never used  Substance and Sexual Activity  . Alcohol use: No  . Drug use: No  . Sexual activity: Not on file  Other Topics Concern  . Not on file  Social History Narrative  . Not on file  Social Determinants of Health   Financial Resource Strain: Not on file  Food Insecurity: Not on file  Transportation Needs: Not on file  Physical Activity: Not on file  Stress: Not on file  Social Connections: Not on file  Intimate Partner Violence: Not on file     Review of Systems: General: negative for chills, fever, night sweats or weight changes.  Cardiovascular: negative for chest pain, dyspnea on exertion, edema, orthopnea, palpitations, paroxysmal nocturnal dyspnea or shortness of breath Dermatological: negative for rash Respiratory: negative for cough or wheezing Urologic: negative for hematuria Abdominal: negative for nausea, vomiting, diarrhea, bright red blood per rectum, melena, or  hematemesis Neurologic: negative for visual changes, syncope, or dizziness All other systems reviewed and are otherwise negative except as noted above.    Blood pressure (!) 142/84, pulse 67, height 6\' 2"  (1.88 m), weight 239 lb (108.4 kg).  General appearance: alert and no distress Neck: no adenopathy, no carotid bruit, no JVD, supple, symmetrical, trachea midline and thyroid not enlarged, symmetric, no tenderness/mass/nodules Lungs: clear to auscultation bilaterally Heart: regular rate and rhythm, S1, S2 normal, no murmur, click, rub or gallop Extremities: extremities normal, atraumatic, no cyanosis or edema Pulses: 2+ and symmetric Skin: Skin color, texture, turgor normal. No rashes or lesions Neurologic: Alert and oriented X 3, normal strength and tone. Normal symmetric reflexes. Normal coordination and gait  EKG this rhythm at 67 without ST or T wave changes.  I personally reviewed this EKG.  ASSESSMENT AND PLAN:   Coronary artery disease History of CAD status post LAD PCI and stenting using Cypher drug-eluting stent by myself in December 2005.  He had 30% dominant RCA stenosis and normal LV function at that time.  Because of new onset fatigue and chest discomfort I obtain a stress test on him 03/14/2018 which was abnormal.  This led to heart cath 04/02/2018 via the right femoral approach revealing a patent proximal LAD stent and high-grade mid and distal RCA stenosis both of which were stented with drug-eluting stents.  He has had no recurrent symptoms.  He remains on dual antiplatelet therapy.  Essential hypertension History of essential hypertension a blood pressure measured today 142/84.  He is not on antihypertensive medications.  Hyperlipidemia History of hyperlipidemia intolerant to rosuvastatin and atorvastatin.  He is currently on high-dose simvastatin with a lipid profile performed 05/24/2020 revealing a total cholesterol of 174, LDL of 108 up from an LDL of 78 in June of last  year.  He does admit to dietary discretion.  He is not at goal for secondary prevention.  I am going to begin him on Zetia 10 mg a day and we will recheck a lipid liver profile in 2 months.  He was on Repatha but has discontinued this for personal reasons.      July MD FACP,FACC,FAHA, [facility] 06/03/2020 9:44 AM

## 2020-06-03 NOTE — Assessment & Plan Note (Signed)
History of essential hypertension a blood pressure measured today 142/84.  He is not on antihypertensive medications.

## 2020-06-03 NOTE — Assessment & Plan Note (Signed)
History of hyperlipidemia intolerant to rosuvastatin and atorvastatin.  He is currently on high-dose simvastatin with a lipid profile performed 05/24/2020 revealing a total cholesterol of 174, LDL of 108 up from an LDL of 78 in June of last year.  He does admit to dietary discretion.  He is not at goal for secondary prevention.  I am going to begin him on Zetia 10 mg a day and we will recheck a lipid liver profile in 2 months.  He was on Repatha but has discontinued this for personal reasons.

## 2020-06-06 ENCOUNTER — Other Ambulatory Visit: Payer: Self-pay | Admitting: Cardiovascular Disease

## 2020-07-05 ENCOUNTER — Other Ambulatory Visit: Payer: Self-pay | Admitting: Cardiovascular Disease

## 2020-08-06 LAB — HEPATIC FUNCTION PANEL
ALT: 27 IU/L (ref 0–44)
AST: 28 IU/L (ref 0–40)
Albumin: 4.4 g/dL (ref 3.8–4.9)
Alkaline Phosphatase: 94 IU/L (ref 44–121)
Bilirubin Total: 0.6 mg/dL (ref 0.0–1.2)
Bilirubin, Direct: 0.19 mg/dL (ref 0.00–0.40)
Total Protein: 6.9 g/dL (ref 6.0–8.5)

## 2020-08-06 LAB — LIPID PANEL
Chol/HDL Ratio: 3.4 ratio (ref 0.0–5.0)
Cholesterol, Total: 125 mg/dL (ref 100–199)
HDL: 37 mg/dL — ABNORMAL LOW (ref >39)
LDL Chol Calc (NIH): 72 mg/dL (ref 0–99)
Triglycerides: 83 mg/dL (ref 0–149)
VLDL Cholesterol Cal: 16 mg/dL (ref 5–40)

## 2020-08-07 ENCOUNTER — Encounter: Payer: Self-pay | Admitting: *Deleted

## 2021-06-14 ENCOUNTER — Other Ambulatory Visit: Payer: Self-pay | Admitting: Cardiovascular Disease

## 2021-06-27 ENCOUNTER — Other Ambulatory Visit: Payer: Self-pay | Admitting: Cardiovascular Disease

## 2021-07-17 ENCOUNTER — Other Ambulatory Visit: Payer: Self-pay | Admitting: Cardiovascular Disease

## 2021-08-16 ENCOUNTER — Other Ambulatory Visit: Payer: Self-pay | Admitting: Cardiovascular Disease

## 2021-09-18 ENCOUNTER — Other Ambulatory Visit: Payer: Self-pay | Admitting: Cardiovascular Disease

## 2021-10-01 ENCOUNTER — Other Ambulatory Visit: Payer: Self-pay | Admitting: Cardiovascular Disease

## 2021-10-19 ENCOUNTER — Other Ambulatory Visit: Payer: Self-pay | Admitting: Cardiovascular Disease

## 2021-10-30 ENCOUNTER — Other Ambulatory Visit: Payer: Self-pay | Admitting: Cardiovascular Disease

## 2021-11-03 ENCOUNTER — Other Ambulatory Visit: Payer: Self-pay | Admitting: Cardiovascular Disease

## 2021-12-04 ENCOUNTER — Other Ambulatory Visit: Payer: Self-pay | Admitting: Cardiovascular Disease

## 2021-12-17 ENCOUNTER — Other Ambulatory Visit: Payer: Self-pay | Admitting: Cardiovascular Disease

## 2022-01-10 ENCOUNTER — Ambulatory Visit
Payer: No Typology Code available for payment source | Attending: Family Medicine | Admitting: Rehabilitative and Restorative Service Providers"

## 2022-01-10 ENCOUNTER — Other Ambulatory Visit: Payer: Self-pay

## 2022-01-10 ENCOUNTER — Encounter: Payer: Self-pay | Admitting: Rehabilitative and Restorative Service Providers"

## 2022-01-10 DIAGNOSIS — R200 Anesthesia of skin: Secondary | ICD-10-CM | POA: Insufficient documentation

## 2022-01-10 DIAGNOSIS — R202 Paresthesia of skin: Secondary | ICD-10-CM | POA: Insufficient documentation

## 2022-01-10 DIAGNOSIS — M6281 Muscle weakness (generalized): Secondary | ICD-10-CM | POA: Insufficient documentation

## 2022-01-10 DIAGNOSIS — R252 Cramp and spasm: Secondary | ICD-10-CM

## 2022-01-10 NOTE — Therapy (Signed)
OUTPATIENT PHYSICAL THERAPY EVALUATION   Patient Name: Phillip Mercado MRN: [id number] DOB:16-Apr-1960, 61 y.o., male Today's Date: 01/10/2022   PT End of Session - 01/10/22 1024     Visit Number 1    Date for PT Re-Evaluation 03/04/22    Authorization Type [employer]    PT Start Time 1015    PT Stop Time 1055    PT Time Calculation (min) 40 min    Activity Tolerance Patient tolerated treatment well    Behavior During Therapy [facility] for tasks assessed/performed             Past Medical History:  Diagnosis Date   CAD (coronary artery disease) 01/08/2010   R/S MV - EF 58%; normal pattern of perfusion in all regions; no significant wall abnormalities noted; exercise capacity 13 METS, EKG negative for ischemia   Claudication (HCC) 11/29/2005   bilateral ABIs no evidence of arterial insufficiency; bilateral PVRs, normal values; bilateral lower extremities demonstrate normal values with no suggestion of significant diameter reduction, dissection, aneurysmal dilation or anormality   Hyperlipidemia    Hypertension    Pneumonia    history of pna 3 times    Past Surgical History:  Procedure Laterality Date   CARDIAC CATHETERIZATION  02/10/2004   mid L anterior descending PCI with 30.13 Cypher stent, resulting in a 90% lesion to a 0% residual   CARDIAC CATHETERIZATION  06/09/2004   30% stenosis in RCA; widely patent L anterior descending   CORONARY STENT INTERVENTION N/A 04/02/2018   Procedure: CORONARY STENT INTERVENTION;  Surgeon: Runell Gess, MD;  Location: [facility];  Service: Cardiovascular;  Laterality: N/A;   LEFT HEART CATH AND CORONARY ANGIOGRAPHY N/A 04/02/2018   Procedure: LEFT HEART CATH AND CORONARY ANGIOGRAPHY;  Surgeon: Runell Gess, MD;  Location: [facility];  Service: Cardiovascular;  Laterality: N/A;   TONSILLECTOMY     Patient Active Problem List   Diagnosis Date Noted   CAD (coronary artery disease) 04/02/2018   Coronary artery disease 11/08/2013    Essential hypertension 11/08/2013   Hyperlipidemia 11/08/2013    PCP: Farris Has, MD  REFERRING PROVIDER: Farris Has, MD  REFERRING DIAG: R20.2 (ICD-10-CM) - Tingling in extremities  Rationale for Evaluation and Treatment: Rehabilitation  THERAPY DIAG:  Muscle weakness (generalized) - Plan: PT plan of care cert/re-cert  Numbness and tingling - Plan: PT plan of care cert/re-cert  Cramp and spasm - Plan: PT plan of care cert/re-cert  ONSET DATE: 04/02/2018 following heart cath  SUBJECTIVE:  SUBJECTIVE STATEMENT: Pt reports that he was in his usual health until January 2020 when he noted that he was having some tingling in his left arm.  Following a stress test on 03/14/2018 and then a cardiac cath and stent placement on 04/02/2018.  Since that time, pt has had some tingling in his left arm.  PERTINENT HISTORY:  CAD s/p stent placement, HTN  PAIN:  Are you having pain? Yes: NPRS scale: 1-2/10 Pain location: mid back Pain description: dull Aggravating factors: unknown Relieving factors: stretching  PRECAUTIONS: None  WEIGHT BEARING RESTRICTIONS: No  FALLS:  Has patient fallen in last 6 months? No  LIVING ENVIRONMENT: Lives with: lives with their spouse Lives in: House/apartment Stairs: Yes: Internal: 14 steps; on left going up and External: 4 steps; can reach both Has following equipment at home: None  OCCUPATION: Machine/Tools, sells new/used machinery to various places.  PLOF: Independent, Vocation/Vocational requirements: driving, lifting, standing on concrete , and Leisure: Teacher, music soccer, Musician, art, running  PATIENT GOALS: To learn some stretches and decrease numbness and tingling   OBJECTIVE:   DIAGNOSTIC FINDINGS:  N/a  PATIENT SURVEYS:  Quick Dash  25%  SCREENING FOR RED FLAGS: Bowel or bladder incontinence: No Spinal tumors: No Cauda equina syndrome: No Compression fracture: No Abdominal aneurysm: No  COGNITION: Overall cognitive status: Within functional limits for tasks assessed     SENSATION: Numbness and tingling down left arm and right lateral thigh  MUSCLE LENGTH: Hamstrings tightness noted bilat  POSTURE: rounded shoulders  PALPATION: Pt reports tenderness to palpation along right lateral thigh  CERVICAL ROM:   Reports some pain with cervical rotation to right  LOWER EXTREMITY ROM:     WFL  LOWER EXTREMITY MMT:    WFL  UPPER EXTREMITY MMT: WFL except left external rotators 4+/5 Grip strength: right 115 lbs, left 115 lbs  LUMBAR SPECIAL TESTS:  Slump test: Negative  GAIT: Distance walked: >500 ft Assistive device utilized: None Level of assistance: Complete Independence Comments: Pt reports that he is still able to go running with his wife  TODAY'S TREATMENT:                                                                                                                              DATE: 01/10/2022 Reviewed HEP (see below)    PATIENT EDUCATION:  Education details: Issued HEP and provided with green and blue theraband Person educated: Patient Education method: Explanation, Demonstration, and Handouts Education comprehension: verbalized understanding and returned demonstration  HOME EXERCISE PROGRAM: Access Code: [email or web address] URL: [email or web address] Date: 01/10/2022 Prepared by: Clydie Braun Latravis Grine  Exercises - Standing Median Nerve Glide  - 1 x daily - 7 x weekly - 2 sets - 10 reps - Standing Ulnar Nerve Glide  - 1 x daily - 7 x weekly - 2 sets - 10 reps - Standing Radial Nerve Glide  - 1 x daily - 7 x weekly - 2 sets - 10 reps -  Doorway Pec Stretch at 60 Elevation  - 1 x daily - 7 x weekly - 1 sets - 2 reps - 20 sec hold - Shoulder External Rotation and Scapular Retraction with  Resistance  - 1 x daily - 7 x weekly - 2 sets - 10 reps - Seated Levator Scapulae Stretch  - 1 x daily - 7 x weekly - 2 sets - 3 reps - 20 sec hold - Seated Upper Trapezius Stretch  - 1 x daily - 7 x weekly - 2 sets - 3 reps - 20 sec hold - Standing Hamstring Stretch with Step  - 1 x daily - 7 x weekly - 2 sets - 3 reps - 20 sec hold  ASSESSMENT:  CLINICAL IMPRESSION: Patient is a 61 y.o. male who was seen today for physical therapy evaluation and treatment for tingling in extremities. Pts PLOF is independent and able to participate in his active lifestyle activities without difficulty.  Pt reports that since January 2020, he has noted numbness and tingling in his left arm and right lateral thigh. Pt does state that he occasionally has pain when he is driving and has to look over his right shoulder. Additionally, pt reports that at times, he has a loss of grip and drops objects in his left hand.  Pt presents with some left UE external rotator weakness, tight hamstrings, but biggest complaint of numbness and tingling in left UE and right lateral thigh.  Pt would benefit from skilled PT to address his functional impairments.  OBJECTIVE IMPAIRMENTS: decreased strength, impaired sensation, impaired UE functional use, and pain.   ACTIVITY LIMITATIONS: carrying  PARTICIPATION LIMITATIONS: community activity and occupation  PERSONAL FACTORS: Time since onset of injury/illness/exacerbation and 1 comorbidity: CAD with stent placement  are also affecting patient's functional outcome.   REHAB POTENTIAL: Good  CLINICAL DECISION MAKING: Stable/uncomplicated  EVALUATION COMPLEXITY: Low   GOALS: Goals reviewed with patient? Yes  SHORT TERM GOALS: Target date: 01/31/2022  Pt will be independent with initial HEP. Baseline: Goal status: INITIAL  2.  Pt will report no increase in pain when looking over his shoulder when driving. Baseline:  Goal status: INITIAL   LONG TERM GOALS: Target date:  03/05/2023  Pt will be independent with advanced HEP. Baseline:  Goal status: INITIAL  2.  Pt will increase right shoulder ER strength to 5/5 to allow him to perform all desired hobbies and activities. Baseline:  Goal status: INITIAL  3.  Pt will report at least a 60% improvement in numbness and tingling symptoms. Baseline:  Goal status: INITIAL  4.  Pt will report ability to drive without increased numbness and tingling. Baseline:  Goal status: INITIAL   PLAN:  PT FREQUENCY: 1x/week  PT DURATION: 8 weeks  PLANNED INTERVENTIONS: Therapeutic exercises, Therapeutic activity, Neuromuscular re-education, Balance training, Gait training, Patient/Family education, Self Care, Joint mobilization, Joint manipulation, Stair training, Aquatic Therapy, Dry Needling, Electrical stimulation, Spinal manipulation, Spinal mobilization, Cryotherapy, Moist heat, Taping, Traction, Ultrasound, Ionotophoresis 4mg /ml Dexamethasone, Manual therapy, and Re-evaluation.  PLAN FOR NEXT SESSION: assess and progress HEP as indicated, nerve glides   Kyser Wandel, PT 01/10/2022, 11:40 AM   [facility] [address], [place], [place] [address] Phone # [phone] Fax [phone]

## 2022-01-17 ENCOUNTER — Ambulatory Visit: Payer: No Typology Code available for payment source | Admitting: Physical Therapy

## 2022-01-17 ENCOUNTER — Encounter: Payer: Self-pay | Admitting: Physical Therapy

## 2022-01-17 DIAGNOSIS — R202 Paresthesia of skin: Secondary | ICD-10-CM | POA: Diagnosis not present

## 2022-01-17 DIAGNOSIS — M6281 Muscle weakness (generalized): Secondary | ICD-10-CM

## 2022-01-17 DIAGNOSIS — R252 Cramp and spasm: Secondary | ICD-10-CM

## 2022-01-17 DIAGNOSIS — R200 Anesthesia of skin: Secondary | ICD-10-CM

## 2022-01-17 NOTE — Therapy (Signed)
OUTPATIENT PHYSICAL THERAPY TREATMENT NOTE   Patient Name: Phillip Mercado MRN: [id number] DOB:1960-04-21, 61 y.o., male Today's Date: 01/17/2022  PCP: Farris Has, MD  REFERRING PROVIDER: Farris Has, MD   END OF SESSION:   PT End of Session - 01/17/22 0803     Visit Number 2    Date for PT Re-Evaluation 03/04/22    Authorization Type [employer]    PT Start Time 0803    PT Stop Time 0853    PT Time Calculation (min) 50 min    Activity Tolerance Patient tolerated treatment well    Behavior During Therapy [facility] for tasks assessed/performed             Past Medical History:  Diagnosis Date   CAD (coronary artery disease) 01/08/2010   R/S MV - EF 58%; normal pattern of perfusion in all regions; no significant wall abnormalities noted; exercise capacity 13 METS, EKG negative for ischemia   Claudication (HCC) 11/29/2005   bilateral ABIs no evidence of arterial insufficiency; bilateral PVRs, normal values; bilateral lower extremities demonstrate normal values with no suggestion of significant diameter reduction, dissection, aneurysmal dilation or anormality   Hyperlipidemia    Hypertension    Pneumonia    history of pna 3 times    Past Surgical History:  Procedure Laterality Date   CARDIAC CATHETERIZATION  02/10/2004   mid L anterior descending PCI with 30.13 Cypher stent, resulting in a 90% lesion to a 0% residual   CARDIAC CATHETERIZATION  06/09/2004   30% stenosis in RCA; widely patent L anterior descending   CORONARY STENT INTERVENTION N/A 04/02/2018   Procedure: CORONARY STENT INTERVENTION;  Surgeon: Runell Gess, MD;  Location: [facility];  Service: Cardiovascular;  Laterality: N/A;   LEFT HEART CATH AND CORONARY ANGIOGRAPHY N/A 04/02/2018   Procedure: LEFT HEART CATH AND CORONARY ANGIOGRAPHY;  Surgeon: Runell Gess, MD;  Location: [facility];  Service: Cardiovascular;  Laterality: N/A;   TONSILLECTOMY     Patient Active Problem List   Diagnosis Date  Noted   CAD (coronary artery disease) 04/02/2018   Coronary artery disease 11/08/2013   Essential hypertension 11/08/2013   Hyperlipidemia 11/08/2013    REFERRING DIAG:  R20.2 (ICD-10-CM) - Tingling in extremities   THERAPY DIAG:  Muscle weakness (generalized)  Numbness and tingling  Cramp and spasm  Rationale for Evaluation and Treatment Rehabilitation  PERTINENT HISTORY: CAD s/p stent placement, HTN   PRECAUTIONS: none  SUBJECTIVE:  SUBJECTIVE STATEMENT:  Doing HEP but no significant changes.   PAIN:  Are you having pain? No "pain" but tingling is the same.    OBJECTIVE: (objective measures completed at initial evaluation unless otherwise dated)  DIAGNOSTIC FINDINGS:  N/a   PATIENT SURVEYS:  Quick Dash 25%   SCREENING FOR RED FLAGS: Bowel or bladder incontinence: No Spinal tumors: No Cauda equina syndrome: No Compression fracture: No Abdominal aneurysm: No   COGNITION: Overall cognitive status: Within functional limits for tasks assessed                          SENSATION: Numbness and tingling down left arm and right lateral thigh   MUSCLE LENGTH: Hamstrings tightness noted bilat   POSTURE: rounded shoulders   PALPATION: Pt reports tenderness to palpation along right lateral thigh   CERVICAL ROM:    Reports some pain with cervical rotation to right   LOWER EXTREMITY ROM:      WFL   LOWER EXTREMITY MMT:     WFL   UPPER EXTREMITY MMT: WFL except left external rotators 4+/5 Grip strength: right 115 lbs, left 115 lbs   LUMBAR SPECIAL TESTS:  Slump test: Negative   GAIT: Distance walked: >500 ft Assistive device utilized: None Level of assistance: Complete Independence Comments: Pt reports that he is still able to go running with his wife   TODAY'S  TREATMENT:   01/17/22: Self Care: postural education: reversing his posture often during the work day, looking to see what changes he can make as he may frequently look LT and down.  Review of HEP: Pt independent and compliant Manual: Soft tissue cervical, LT > RT, LT SCM TP trigger point release, LT upper trap release. Mechanical traction: 10 min 17#/5 #intermittent 60/20. Pillows under arms for support.                                                                                                                              DATE: 01/10/2022 Reviewed HEP (see below)      PATIENT EDUCATION:  Education details: Issued HEP and provided with green and blue theraband Person educated: Patient Education method: Explanation, Demonstration, and Handouts Education comprehension: verbalized understanding and returned demonstration   HOME EXERCISE PROGRAM: Access Code: [email or web address] URL: [email or web address] Date: 01/10/2022 Prepared by: Clydie Braun Menke   Exercises - Standing Median Nerve Glide  - 1 x daily - 7 x weekly - 2 sets - 10 reps - Standing Ulnar Nerve Glide  - 1 x daily - 7 x weekly - 2 sets - 10 reps - Standing Radial Nerve Glide  - 1 x daily - 7 x weekly - 2 sets - 10 reps - Doorway Pec Stretch at 60 Elevation  - 1 x daily - 7 x weekly - 1 sets - 2 reps - 20 sec hold - Shoulder External Rotation and Scapular Retraction with Resistance  - 1 x daily -  7 x weekly - 2 sets - 10 reps - Seated Levator Scapulae Stretch  - 1 x daily - 7 x weekly - 2 sets - 3 reps - 20 sec hold - Seated Upper Trapezius Stretch  - 1 x daily - 7 x weekly - 2 sets - 3 reps - 20 sec hold - Standing Hamstring Stretch with Step  - 1 x daily - 7 x weekly - 2 sets - 3 reps - 20 sec hold   ASSESSMENT:   CLINICAL IMPRESSION: Pt arrives with no pain but tingling in his Lt fingers. Pt sits and stands with forward head and rounded shoulders. PTA educated pt in postural principles of postural reversal  throughout the day and shooting for less of a forward head. Pt verbally understood all principles. Lt SCM with trigger point and Lt upper trap very thick and dense. Trial of mechanical traction today.     OBJECTIVE IMPAIRMENTS: decreased strength, impaired sensation, impaired UE functional use, and pain.    ACTIVITY LIMITATIONS: carrying   PARTICIPATION LIMITATIONS: community activity and occupation   PERSONAL FACTORS: Time since onset of injury/illness/exacerbation and 1 comorbidity: CAD with stent placement  are also affecting patient's functional outcome.    REHAB POTENTIAL: Good   CLINICAL DECISION MAKING: Stable/uncomplicated   EVALUATION COMPLEXITY: Low     GOALS: Goals reviewed with patient? Yes   SHORT TERM GOALS: Target date: 01/31/2022   Pt will be independent with initial HEP. Baseline: Goal status: INITIAL   2.  Pt will report no increase in pain when looking over his shoulder when driving. Baseline:  Goal status: INITIAL     LONG TERM GOALS: Target date: 03/05/2023   Pt will be independent with advanced HEP. Baseline:  Goal status: INITIAL   2.  Pt will increase right shoulder ER strength to 5/5 to allow him to perform all desired hobbies and activities. Baseline:  Goal status: INITIAL   3.  Pt will report at least a 60% improvement in numbness and tingling symptoms. Baseline:  Goal status: INITIAL   4.  Pt will report ability to drive without increased numbness and tingling. Baseline:  Goal status: INITIAL     PLAN:   PT FREQUENCY: 1x/week   PT DURATION: 8 weeks   PLANNED INTERVENTIONS: Therapeutic exercises, Therapeutic activity, Neuromuscular re-education, Balance training, Gait training, Patient/Family education, Self Care, Joint mobilization, Joint manipulation, Stair training, Aquatic Therapy, Dry Needling, Electrical stimulation, Spinal manipulation, Spinal mobilization, Cryotherapy, Moist heat, Taping, Traction, Ultrasound, Ionotophoresis  4mg /ml Dexamethasone, Manual therapy, and Re-evaluation.   PLAN FOR NEXT SESSION: assess todays treatment and continue with traction if helpful in reducing finger symptoms.   Crystalee Ventress, PTA 01/17/2022, 8:49 AM

## 2022-01-23 NOTE — Therapy (Unsigned)
OUTPATIENT PHYSICAL THERAPY TREATMENT NOTE   Patient Name: Phillip Mercado MRN: [id number] DOB:Aug 19, 1960, 61 y.o., male Today's Date: 01/24/2022  PCP: [name], MD  REFERRING PROVIDER: [name], MD   END OF SESSION:   PT End of Session - 01/24/22 0815     Visit Number 3    Date for PT Re-Evaluation 03/04/22    Authorization Type U[employer]    PT Start Time 0812    PT Stop Time 0900    PT Time Calculation (min) 48 min    Activity Tolerance Patient tolerated treatment well    Behavior During Therapy [facility] for tasks assessed/performed              Past Medical History:  Diagnosis Date   CAD (coronary artery disease) 01/08/2010   R/S MV - EF 58%; normal pattern of perfusion in all regions; no significant wall abnormalities noted; exercise capacity 13 METS, EKG negative for ischemia   Claudication ([place]) 11/29/2005   bilateral ABIs no evidence of arterial insufficiency; bilateral PVRs, normal values; bilateral lower extremities demonstrate normal values with no suggestion of significant diameter reduction, dissection, aneurysmal dilation or anormality   Hyperlipidemia    Hypertension    Pneumonia    history of pna 3 times    Past Surgical History:  Procedure Laterality Date   CARDIAC CATHETERIZATION  02/10/2004   mid L anterior descending PCI with 30.13 Cypher stent, resulting in a 90% lesion to a 0% residual   CARDIAC CATHETERIZATION  06/09/2004   30% stenosis in RCA; widely patent L anterior descending   CORONARY STENT INTERVENTION N/A 04/02/2018   Procedure: CORONARY STENT INTERVENTION;  Surgeon: Lorretta Harp, MD;  Location: [place] [facility];  Service: Cardiovascular;  Laterality: N/A;   LEFT HEART CATH AND CORONARY ANGIOGRAPHY N/A 04/02/2018   Procedure: LEFT HEART CATH AND CORONARY ANGIOGRAPHY;  Surgeon: Lorretta Harp, MD;  Location: [place] [facility];  Service: Cardiovascular;  Laterality: N/A;   TONSILLECTOMY     Patient Active Problem List   Diagnosis Date  Noted   CAD (coronary artery disease) 04/02/2018   Coronary artery disease 11/08/2013   Essential hypertension 11/08/2013   Hyperlipidemia 11/08/2013    REFERRING DIAG:  R20.2 (ICD-10-CM) - Tingling in extremities   THERAPY DIAG:  Muscle weakness (generalized)  Numbness and tingling  Cramp and spasm  Rationale for Evaluation and Treatment Rehabilitation  PERTINENT HISTORY: CAD s/p stent placement, HTN   PRECAUTIONS: none  SUBJECTIVE:  SUBJECTIVE STATEMENT: I was sick last week. Having difficulties with with cramping from dehydration. I did good after my first visit.    PAIN:  Are you having pain? No "pain" but tingling is the same.    OBJECTIVE: (objective measures completed at initial evaluation unless otherwise dated)  DIAGNOSTIC FINDINGS:  N/a   PATIENT SURVEYS:  Quick Dash 25%   SCREENING FOR RED FLAGS: Bowel or bladder incontinence: No Spinal tumors: No Cauda equina syndrome: No Compression fracture: No Abdominal aneurysm: No   COGNITION: Overall cognitive status: Within functional limits for tasks assessed                          SENSATION: Numbness and tingling down left arm and right lateral thigh   MUSCLE LENGTH: Hamstrings tightness noted bilat   POSTURE: rounded shoulders   PALPATION: Pt reports tenderness to palpation along right lateral thigh   CERVICAL ROM:    Reports some pain with cervical rotation to right   LOWER EXTREMITY ROM:      WFL   LOWER EXTREMITY MMT:     WFL   UPPER EXTREMITY MMT: WFL except left external rotators 4+/5 Grip strength: right 115 lbs, left 115 lbs   LUMBAR SPECIAL TESTS:  Slump test: Negative   GAIT: Distance walked: >500 ft Assistive device utilized: None Level of assistance: Complete Independence Comments: Pt  reports that he is still able to go running with his wife   TODAY'S TREATMENT:   01/24/22: Arm bike reverse: L 1.3 5 min 5 min Supine head on blue physioball; chink/head press 5 sec hold, VC for chest open/scap down, 6x: add 3# scissor arms 10x, flies 10x, then open/close door: (pronation/sup) Thoracic extension over ball 10x: 2 different segments 10x each Mechanical traction: 15 min 18#/5 #intermittent 60/20. Pillows under arms for support.  01/17/22: Self Care: postural education: reversing his posture often during the work day, looking to see what changes he can make as he may frequently look LT and down.  Review of HEP: Pt independent and compliant Manual: Soft tissue cervical, LT > RT, LT SCM TP trigger point release, LT upper trap release. Mechanical traction: 10 min 17#/5 #intermittent 60/20. Pillows under arms for support.                                                                                                                              DATE: 01/10/2022 Reviewed HEP (see below)      PATIENT EDUCATION:  Education details: Issued HEP and provided with green and blue theraband Person educated: Patient Education method: Explanation, Demonstration, and Handouts Education comprehension: verbalized understanding and returned demonstration   HOME EXERCISE PROGRAM: Access Code: [email or web address] URL: [email or web address] Date: 01/10/2022 Prepared by: Shelby Dubin Menke   Exercises - Standing Median Nerve Glide  - 1 x daily - 7 x weekly - 2 sets - 10 reps - Standing Ulnar Nerve Glide  - 1  x daily - 7 x weekly - 2 sets - 10 reps - Standing Radial Nerve Glide  - 1 x daily - 7 x weekly - 2 sets - 10 reps - Doorway Pec Stretch at 60 Elevation  - 1 x daily - 7 x weekly - 1 sets - 2 reps - 20 sec hold - Shoulder External Rotation and Scapular Retraction with Resistance  - 1 x daily - 7 x weekly - 2 sets - 10 reps - Seated Levator Scapulae Stretch  - 1 x daily - 7 x weekly - 2  sets - 3 reps - 20 sec hold - Seated Upper Trapezius Stretch  - 1 x daily - 7 x weekly - 2 sets - 3 reps - 20 sec hold - Standing Hamstring Stretch with Step  - 1 x daily - 7 x weekly - 2 sets - 3 reps - 20 sec hold   ASSESSMENT:   CLINICAL IMPRESSION: Pt arrives with reports of missing PT last week d/t being sick. He reports his tingling/numbness is a touch better and he is more aware of his posture during the day. Today we introduced cervical stabs using the blue ball (pt has one at home he could use) and thoracic mobility using the soft roll. Pt is considering buying a roll for home use.    OBJECTIVE IMPAIRMENTS: decreased strength, impaired sensation, impaired UE functional use, and pain.    ACTIVITY LIMITATIONS: carrying   PARTICIPATION LIMITATIONS: community activity and occupation   PERSONAL FACTORS: Time since onset of injury/illness/exacerbation and 1 comorbidity: CAD with stent placement  are also affecting patient's functional outcome.    REHAB POTENTIAL: Good   CLINICAL DECISION MAKING: Stable/uncomplicated   EVALUATION COMPLEXITY: Low     GOALS: Goals reviewed with patient? Yes   SHORT TERM GOALS: Target date: 01/31/2022   Pt will be independent with initial HEP. Baseline: Goal status: Goal met 01/24/22   2.  Pt will report no increase in pain when looking over his shoulder when driving. Baseline:  Goal status: INITIAL     LONG TERM GOALS: Target date: 03/05/2023   Pt will be independent with advanced HEP. Baseline:  Goal status: INITIAL   2.  Pt will increase right shoulder ER strength to 5/5 to allow him to perform all desired hobbies and activities. Baseline:  Goal status: INITIAL   3.  Pt will report at least a 60% improvement in numbness and tingling symptoms. Baseline:  Goal status: INITIAL   4.  Pt will report ability to drive without increased numbness and tingling. Baseline:  Goal status: INITIAL     PLAN:   PT FREQUENCY: 1x/week   PT  DURATION: 8 weeks   PLANNED INTERVENTIONS: Therapeutic exercises, Therapeutic activity, Neuromuscular re-education, Balance training, Gait training, Patient/Family education, Self Care, Joint mobilization, Joint manipulation, Stair training, Aquatic Therapy, Dry Needling, Electrical stimulation, Spinal manipulation, Spinal mobilization, Cryotherapy, Moist heat, Taping, Traction, Ultrasound, Ionotophoresis 23m/ml Dexamethasone, Manual therapy, and Re-evaluation.   PLAN FOR NEXT SESSION: Review cervical stabs and see if pt was able to find a place home to incorporate, thoracic extension/mobility, continue with mechanical traction if helping with radicular symptoms.     Rhetta Cleek, PTA 01/24/2022, 8:49 AM

## 2022-01-24 ENCOUNTER — Ambulatory Visit: Payer: No Typology Code available for payment source | Admitting: Physical Therapy

## 2022-01-24 ENCOUNTER — Encounter: Payer: Self-pay | Admitting: Physical Therapy

## 2022-01-24 DIAGNOSIS — M6281 Muscle weakness (generalized): Secondary | ICD-10-CM

## 2022-01-24 DIAGNOSIS — R200 Anesthesia of skin: Secondary | ICD-10-CM

## 2022-01-24 DIAGNOSIS — R252 Cramp and spasm: Secondary | ICD-10-CM

## 2022-01-24 DIAGNOSIS — R202 Paresthesia of skin: Secondary | ICD-10-CM | POA: Diagnosis not present

## 2022-01-30 NOTE — Therapy (Signed)
OUTPATIENT PHYSICAL THERAPY TREATMENT NOTE   Patient Name: Phillip Mercado MRN: [id number] DOB:1960/07/05, 61 y.o., male Today's Date: 01/31/2022  PCP: [name], MD  REFERRING PROVIDER: [name], MD   END OF SESSION:   PT End of Session - 01/31/22 0755     Visit Number 4    Date for PT Re-Evaluation 03/04/22    Authorization Type UHC    PT Start Time 8616    PT Stop Time 0847    PT Time Calculation (min) 50 min    Activity Tolerance Patient tolerated treatment well    Behavior During Therapy [facility] for tasks assessed/performed               Past Medical History:  Diagnosis Date   CAD (coronary artery disease) 01/08/2010   R/S MV - EF 58%; normal pattern of perfusion in all regions; no significant wall abnormalities noted; exercise capacity 13 METS, EKG negative for ischemia   Claudication ([place]) 11/29/2005   bilateral ABIs no evidence of arterial insufficiency; bilateral PVRs, normal values; bilateral lower extremities demonstrate normal values with no suggestion of significant diameter reduction, dissection, aneurysmal dilation or anormality   Hyperlipidemia    Hypertension    Pneumonia    history of pna 3 times    Past Surgical History:  Procedure Laterality Date   CARDIAC CATHETERIZATION  02/10/2004   mid L anterior descending PCI with 30.13 Cypher stent, resulting in a 90% lesion to a 0% residual   CARDIAC CATHETERIZATION  06/09/2004   30% stenosis in RCA; widely patent L anterior descending   CORONARY STENT INTERVENTION N/A 04/02/2018   Procedure: CORONARY STENT INTERVENTION;  Surgeon: Lorretta Harp, MD;  Location: [place] [facility];  Service: Cardiovascular;  Laterality: N/A;   LEFT HEART CATH AND CORONARY ANGIOGRAPHY N/A 04/02/2018   Procedure: LEFT HEART CATH AND CORONARY ANGIOGRAPHY;  Surgeon: Lorretta Harp, MD;  Location: [place] [facility];  Service: Cardiovascular;  Laterality: N/A;   TONSILLECTOMY     Patient Active Problem List   Diagnosis  Date Noted   CAD (coronary artery disease) 04/02/2018   Coronary artery disease 11/08/2013   Essential hypertension 11/08/2013   Hyperlipidemia 11/08/2013    REFERRING DIAG:  R20.2 (ICD-10-CM) - Tingling in extremities   THERAPY DIAG:  Muscle weakness (generalized)  Numbness and tingling  Cramp and spasm  Rationale for Evaluation and Treatment Rehabilitation  PERTINENT HISTORY: CAD s/p stent placement, HTN   PRECAUTIONS: none  SUBJECTIVE:  SUBJECTIVE STATEMENT: Holidays made it hard to do any homework.  PAIN:  Are you having pain? No current pain, tingling is about the same as last week. I bought a roller for home use but I need to review what to do.    OBJECTIVE: (objective measures completed at initial evaluation unless otherwise dated)  DIAGNOSTIC FINDINGS:  N/a   PATIENT SURVEYS:  Quick Dash 25%   SCREENING FOR RED FLAGS: Bowel or bladder incontinence: No Spinal tumors: No Cauda equina syndrome: No Compression fracture: No Abdominal aneurysm: No   COGNITION: Overall cognitive status: Within functional limits for tasks assessed                          SENSATION: Numbness and tingling down left arm and right lateral thigh   MUSCLE LENGTH: Hamstrings tightness noted bilat   POSTURE: rounded shoulders   PALPATION: Pt reports tenderness to palpation along right lateral thigh   CERVICAL ROM:    Reports some pain with cervical rotation to right   LOWER EXTREMITY ROM:      WFL   LOWER EXTREMITY MMT:     WFL   UPPER EXTREMITY MMT: WFL except left external rotators 4+/5 Grip strength: right 115 lbs, left 115 lbs   LUMBAR SPECIAL TESTS:  Slump test: Negative   GAIT: Distance walked: >500 ft Assistive device utilized: None Level of assistance: Complete  Independence Comments: Pt reports that he is still able to go running with his wife   TODAY'S TREATMENT:   01/31/22:  Supine on foam roll: 2 min diaphragmatic breathing, then blue band scap unattached series: 2x10 with slight head press/chin tuck Thoracic extension on roll 2x10 Mechanical traction: 15 min 19#/5 #intermittent 60/20. Pillows under arms for support. Blue band shoulder ext rotation 10x: good return demo for home  01/24/22: Arm bike reverse: L 1.3 5 min 5 min Supine head on blue physioball; chink/head press 5 sec hold, VC for chest open/scap down, 6x: add 3# scissor arms 10x, flies 10x, then open/close door: (pronation/sup) Thoracic extension over ball 10x: 2 different segments 10x each Mechanical traction: 15 min 18#/5 #intermittent 60/20. Pillows under arms for support.  01/17/22: Self Care: postural education: reversing his posture often during the work day, looking to see what changes he can make as he may frequently look LT and down.  Review of HEP: Pt independent and compliant Manual: Soft tissue cervical, LT > RT, LT SCM TP trigger point release, LT upper trap release. Mechanical traction: 10 min 17#/5 #intermittent 60/20. Pillows under arms for support.                                                                                                                              DATE: 01/10/2022 Reviewed HEP (see below)      PATIENT EDUCATION:  Education details: Issued HEP and provided with green and blue theraband Person educated: Patient Education method: Explanation,  Demonstration, and Handouts Education comprehension: verbalized understanding and returned demonstration   HOME EXERCISE PROGRAM: Access Code: [email or web address] URL: [email or web address] Date: 01/10/2022 Prepared by: Shelby Dubin Menke   Exercises - Standing Median Nerve Glide  - 1 x daily - 7 x weekly - 2 sets - 10 reps - Standing Ulnar Nerve Glide  - 1 x daily - 7 x weekly - 2 sets - 10  reps - Standing Radial Nerve Glide  - 1 x daily - 7 x weekly - 2 sets - 10 reps - Doorway Pec Stretch at 60 Elevation  - 1 x daily - 7 x weekly - 1 sets - 2 reps - 20 sec hold - Shoulder External Rotation and Scapular Retraction with Resistance  - 1 x daily - 7 x weekly - 2 sets - 10 reps - Seated Levator Scapulae Stretch  - 1 x daily - 7 x weekly - 2 sets - 3 reps - 20 sec hold - Seated Upper Trapezius Stretch  - 1 x daily - 7 x weekly - 2 sets - 3 reps - 20 sec hold - Standing Hamstring Stretch with Step  - 1 x daily - 7 x weekly - 2 sets - 3 reps - 20 sec hold   ASSESSMENT:   CLINICAL IMPRESSION: Pt was not able to do much homework over the holiday break. Pt arrives with no pain with remains with some tingling/numbness in his left fingers. Patient reports 30% less pain when looking over his left shoulder when driving. Pt bought a foam roll for home to work on his thoracic extension.   OBJECTIVE IMPAIRMENTS: decreased strength, impaired sensation, impaired UE functional use, and pain.    ACTIVITY LIMITATIONS: carrying   PARTICIPATION LIMITATIONS: community activity and occupation   PERSONAL FACTORS: Time since onset of injury/illness/exacerbation and 1 comorbidity: CAD with stent placement  are also affecting patient's functional outcome.    REHAB POTENTIAL: Good   CLINICAL DECISION MAKING: Stable/uncomplicated   EVALUATION COMPLEXITY: Low     GOALS: Goals reviewed with patient? Yes   SHORT TERM GOALS: Target date: 01/31/2022   Pt will be independent with initial HEP. Baseline: Goal status: Goal met 01/24/22   2.  Pt will report no increase in pain when looking over his shoulder when driving. Baseline:  Goal status: Pain is 30% less looking over shoulder.     LONG TERM GOALS: Target date: 03/05/2023   Pt will be independent with advanced HEP. Baseline:  Goal status: INITIAL   2.  Pt will increase right shoulder ER strength to 5/5 to allow him to perform all desired  hobbies and activities. Baseline:  Goal status: INITIAL   3.  Pt will report at least a 60% improvement in numbness and tingling symptoms. Baseline:  Goal status: INITIAL   4.  Pt will report ability to drive without increased numbness and tingling. Baseline:  Goal status: INITIAL     PLAN:   PT FREQUENCY: 1x/week   PT DURATION: 8 weeks   PLANNED INTERVENTIONS: Therapeutic exercises, Therapeutic activity, Neuromuscular re-education, Balance training, Gait training, Patient/Family education, Self Care, Joint mobilization, Joint manipulation, Stair training, Aquatic Therapy, Dry Needling, Electrical stimulation, Spinal manipulation, Spinal mobilization, Cryotherapy, Moist heat, Taping, Traction, Ultrasound, Ionotophoresis 35m/ml Dexamethasone, Manual therapy, and Re-evaluation.   PLAN FOR NEXT SESSION: Continue with cervical stabilization exercises, mechanical traction, see if there is any need for dry needling to [place] PTA 01/31/2022, 8:32 AM

## 2022-01-31 ENCOUNTER — Ambulatory Visit: Payer: No Typology Code available for payment source | Admitting: Physical Therapy

## 2022-01-31 ENCOUNTER — Encounter: Payer: Self-pay | Admitting: Physical Therapy

## 2022-01-31 DIAGNOSIS — R252 Cramp and spasm: Secondary | ICD-10-CM

## 2022-01-31 DIAGNOSIS — M6281 Muscle weakness (generalized): Secondary | ICD-10-CM

## 2022-01-31 DIAGNOSIS — R202 Paresthesia of skin: Secondary | ICD-10-CM

## 2022-02-06 ENCOUNTER — Other Ambulatory Visit: Payer: Self-pay | Admitting: Cardiovascular Disease

## 2022-02-07 ENCOUNTER — Encounter: Payer: Self-pay | Admitting: Rehabilitative and Restorative Service Providers"

## 2022-02-07 ENCOUNTER — Ambulatory Visit
Payer: No Typology Code available for payment source | Attending: Family Medicine | Admitting: Rehabilitative and Restorative Service Providers"

## 2022-02-07 DIAGNOSIS — R252 Cramp and spasm: Secondary | ICD-10-CM | POA: Diagnosis present

## 2022-02-07 DIAGNOSIS — M6281 Muscle weakness (generalized): Secondary | ICD-10-CM | POA: Insufficient documentation

## 2022-02-07 DIAGNOSIS — R202 Paresthesia of skin: Secondary | ICD-10-CM | POA: Insufficient documentation

## 2022-02-07 DIAGNOSIS — R200 Anesthesia of skin: Secondary | ICD-10-CM | POA: Diagnosis present

## 2022-02-07 NOTE — Patient Instructions (Signed)

## 2022-02-07 NOTE — Therapy (Signed)
OUTPATIENT PHYSICAL THERAPY TREATMENT NOTE   Patient Name: Phillip Mercado MRN: [id number] DOB:05/23/1960, 61 y.o., male Today's Date: 02/07/2022  PCP: [name], MD  REFERRING PROVIDER: [name], MD   END OF SESSION:   PT End of Session - 02/07/22 0927     Visit Number 5    Date for PT Re-Evaluation 03/04/22    Authorization Type [employer]    PT Start Time 0925    PT Stop Time 1005    PT Time Calculation (min) 40 min    Activity Tolerance Patient tolerated treatment well    Behavior During Therapy [facility] for tasks assessed/performed               Past Medical History:  Diagnosis Date   CAD (coronary artery disease) 01/08/2010   R/S MV - EF 58%; normal pattern of perfusion in all regions; no significant wall abnormalities noted; exercise capacity 13 METS, EKG negative for ischemia   Claudication ([place]) 11/29/2005   bilateral ABIs no evidence of arterial insufficiency; bilateral PVRs, normal values; bilateral lower extremities demonstrate normal values with no suggestion of significant diameter reduction, dissection, aneurysmal dilation or anormality   Hyperlipidemia    Hypertension    Pneumonia    history of pna 3 times    Past Surgical History:  Procedure Laterality Date   CARDIAC CATHETERIZATION  02/10/2004   mid L anterior descending PCI with 30.13 Cypher stent, resulting in a 90% lesion to a 0% residual   CARDIAC CATHETERIZATION  06/09/2004   30% stenosis in RCA; widely patent L anterior descending   CORONARY STENT INTERVENTION N/A 04/02/2018   Procedure: CORONARY STENT INTERVENTION;  Surgeon: Lorretta Harp, MD;  Location: [place] [facility];  Service: Cardiovascular;  Laterality: N/A;   LEFT HEART CATH AND CORONARY ANGIOGRAPHY N/A 04/02/2018   Procedure: LEFT HEART CATH AND CORONARY ANGIOGRAPHY;  Surgeon: Lorretta Harp, MD;  Location: [place] [facility];  Service: Cardiovascular;  Laterality: N/A;   TONSILLECTOMY     Patient Active Problem List   Diagnosis  Date Noted   CAD (coronary artery disease) 04/02/2018   Coronary artery disease 11/08/2013   Essential hypertension 11/08/2013   Hyperlipidemia 11/08/2013    REFERRING DIAG:  R20.2 (ICD-10-CM) - Tingling in extremities   THERAPY DIAG:  Muscle weakness (generalized)  Numbness and tingling  Cramp and spasm  Rationale for Evaluation and Treatment Rehabilitation  PERTINENT HISTORY: CAD s/p stent placement, HTN   PRECAUTIONS: none  SUBJECTIVE:  SUBJECTIVE STATEMENT: Pt reports that he is having an easier time looking over his shoulder without the same numbness.  Pt denies pain, but still having numbness and tingling.  PAIN:  Are you having pain? No current pain, tingling/numbness rating of 5/10. I bought a roller for home use but I need to review what to do.    OBJECTIVE: (objective measures completed at initial evaluation unless otherwise dated)  DIAGNOSTIC FINDINGS:  N/a   PATIENT SURVEYS:  Eval:  Quick Dash 25%   SCREENING FOR RED FLAGS: Bowel or bladder incontinence: No Spinal tumors: No Cauda equina syndrome: No Compression fracture: No Abdominal aneurysm: No   COGNITION: Overall cognitive status: Within functional limits for tasks assessed                          SENSATION: Numbness and tingling down left arm and right lateral thigh   MUSCLE LENGTH: Hamstrings tightness noted bilat   POSTURE: rounded shoulders   PALPATION: Pt reports tenderness to palpation along right lateral thigh   CERVICAL ROM:    Reports some pain with cervical rotation to right   LOWER EXTREMITY ROM:      WFL   LOWER EXTREMITY MMT:     WFL   UPPER EXTREMITY MMT: WFL except left external rotators 4+/5 Grip strength: right 115 lbs, left 115 lbs   LUMBAR SPECIAL TESTS:  Slump test: Negative    GAIT: Distance walked: >500 ft Assistive device utilized: None Level of assistance: Complete Independence Comments: Pt reports that he is still able to go running with his wife   TODAY'S TREATMENT:   02/07/2022: UBE level 1.5 x3 min each direction with PT present to discuss status Standing rows and shoulder extension with green tband 2x10 each  Seated on blue pball with 4 way pelvic tilts x20 each Seated on blue pball with pelvic circles CW and CCW x20 each Standing hamstring stretch at stairs 2x20 sec bilat Trigger Point Dry-Needling  Treatment instructions: Expect mild to moderate muscle soreness. S/S of pneumothorax if dry needled over a lung field, and to seek immediate medical attention should they occur. Patient verbalized understanding of these instructions and education. Patient Consent Given: Yes Education handout provided: Yes Muscles treated: Bilateral cervical multifidi, right upper trap, right rhomboid, right lat, right thoracic multifidi, right lumbar multifidi Electrical stimulation performed: No Parameters: N/A Treatment response/outcome: Utilized skilled palpation to identify trigger points.  Able to palpate twitch response and muscle elongation following. Manual Therapy:  Soft tissue mobilization following and during dry needling to ensure myofascial glide and encourage further tissue elongation.   01/31/22:  Supine on foam roll: 2 min diaphragmatic breathing, then blue band scap unattached series: 2x10 with slight head press/chin tuck Thoracic extension on roll 2x10 Mechanical traction: 15 min 19#/5 #intermittent 60/20. Pillows under arms for support. Blue band shoulder ext rotation 10x: good return demo for home  01/24/22: Arm bike reverse: L 1.3 5 min 5 min Supine head on blue physioball; chink/head press 5 sec hold, VC for chest open/scap down, 6x: add 3# scissor arms 10x, flies 10x, then open/close door: (pronation/sup) Thoracic extension over ball 10x: 2  different segments 10x each Mechanical traction: 15 min 18#/5 #intermittent 60/20. Pillows under arms for support.      PATIENT EDUCATION:  Education details: Issued HEP and provided with green and blue theraband Person educated: Patient Education method: Explanation, Demonstration, and Handouts Education comprehension: verbalized understanding and returned demonstration     HOME EXERCISE PROGRAM: Access Code: [email or web address] URL: [email or web address] Date: 01/10/2022 Prepared by: Shelby Dubin Maricsa Sammons   Exercises - Standing Median Nerve Glide  - 1 x daily - 7 x weekly - 2 sets - 10 reps - Standing Ulnar Nerve Glide  - 1 x daily - 7 x weekly - 2 sets - 10 reps - Standing Radial Nerve Glide  - 1 x daily - 7 x weekly - 2 sets - 10 reps - Doorway Pec Stretch at 60 Elevation  - 1 x daily - 7 x weekly - 1 sets - 2 reps - 20 sec hold - Shoulder External Rotation and Scapular Retraction with Resistance  - 1 x daily - 7 x weekly - 2 sets - 10 reps - Seated Levator Scapulae Stretch  - 1 x daily - 7 x weekly - 2 sets - 3 reps - 20 sec hold - Seated Upper Trapezius Stretch  - 1 x daily - 7 x weekly - 2 sets - 3 reps - 20 sec hold - Standing Hamstring Stretch with Step  - 1 x daily - 7 x weekly - 2 sets - 3 reps - 20 sec hold   ASSESSMENT:   CLINICAL IMPRESSION: Mr Reffner presents to skilled PT reporting that he is making some improvements with decreased numbness.  Pt initially requires cuing during pelvic tilt exercises secondary to stiffness, but able to demonstrate improved motion as he progresses.  Patient educated on dry needling and he was agreeable to service.  Patient with good twitch responses noted, especially to the multifidi muscles.  Patient able to perform lumbar flexion following dry needling reporting increased ease of motion and able to flex more.  Patient continues to require skilled PT to progress towards goal related activities.  OBJECTIVE IMPAIRMENTS: decreased strength,  impaired sensation, impaired UE functional use, and pain.    ACTIVITY LIMITATIONS: carrying   PARTICIPATION LIMITATIONS: community activity and occupation   PERSONAL FACTORS: Time since onset of injury/illness/exacerbation and 1 comorbidity: CAD with stent placement  are also affecting patient's functional outcome.    REHAB POTENTIAL: Good   CLINICAL DECISION MAKING: Stable/uncomplicated   EVALUATION COMPLEXITY: Low     GOALS: Goals reviewed with patient? Yes   SHORT TERM GOALS: Target date: 01/31/2022   Pt will be independent with initial HEP. Baseline: Goal status: Goal met 01/24/22   2.  Pt will report no increase in pain when looking over his shoulder when driving. Baseline:  Goal status: Pain is 30% less looking over shoulder.     LONG TERM GOALS: Target date: 03/05/2023   Pt will be independent with advanced HEP. Baseline:  Goal status: IN PROGRESS   2.  Pt will increase right shoulder ER strength to 5/5 to allow him to perform all desired hobbies and activities. Baseline:  Goal status: INITIAL   3.  Pt will report at least a 60% improvement in numbness and tingling symptoms. Baseline:  Goal status: INITIAL   4.  Pt will report ability to drive without increased numbness and tingling. Baseline:  Goal status: INITIAL     PLAN:   PT FREQUENCY: 1x/week   PT DURATION: 8 weeks   PLANNED INTERVENTIONS: Therapeutic exercises, Therapeutic activity, Neuromuscular re-education, Balance training, Gait training, Patient/Family education, Self Care, Joint mobilization, Joint manipulation, Stair training, Aquatic Therapy, Dry Needling, Electrical stimulation, Spinal manipulation, Spinal mobilization, Cryotherapy, Moist heat, Taping, Traction, Ultrasound, Ionotophoresis 28m/ml Dexamethasone, Manual therapy, and Re-evaluation.   PLAN FOR NEXT SESSION: Continue with cervical stabilization exercises,  mechanical traction, Assess response to dry needling      Juel Burrow, PT 02/07/2022, 10:11 AM  [facility] [address], [place], [place] [address] Phone # [phone] Fax [phone]

## 2022-02-14 ENCOUNTER — Ambulatory Visit: Payer: No Typology Code available for payment source | Admitting: Physical Therapy

## 2022-02-14 ENCOUNTER — Encounter: Payer: Self-pay | Admitting: Physical Therapy

## 2022-02-14 DIAGNOSIS — M6281 Muscle weakness (generalized): Secondary | ICD-10-CM | POA: Diagnosis not present

## 2022-02-14 DIAGNOSIS — R200 Anesthesia of skin: Secondary | ICD-10-CM

## 2022-02-14 DIAGNOSIS — R252 Cramp and spasm: Secondary | ICD-10-CM

## 2022-02-14 NOTE — Therapy (Signed)
OUTPATIENT PHYSICAL THERAPY TREATMENT NOTE   Patient Name: Phillip Mercado MRN: [id number] DOB:[birth year], 61 y.o., male Today's Date: 02/14/2022  PCP: [name], MD  REFERRING PROVIDER: [name], MD   END OF SESSION:   PT End of Session - 02/14/22 0800     Visit Number 6    Date for PT Re-Evaluation 03/04/22    Authorization Type UHC    PT Start Time 0800    PT Stop Time 0845    PT Time Calculation (min) 45 min    Activity Tolerance Patient tolerated treatment well    Behavior During Therapy [facility] for tasks assessed/performed                Past Medical History:  Diagnosis Date   CAD (coronary artery disease) 01/08/2010   R/S MV - EF 58%; normal pattern of perfusion in all regions; no significant wall abnormalities noted; exercise capacity 13 METS, EKG negative for ischemia   Claudication ([place]) 11/29/2005   bilateral ABIs no evidence of arterial insufficiency; bilateral PVRs, normal values; bilateral lower extremities demonstrate normal values with no suggestion of significant diameter reduction, dissection, aneurysmal dilation or anormality   Hyperlipidemia    Hypertension    Pneumonia    history of pna 3 times    Past Surgical History:  Procedure Laterality Date   CARDIAC CATHETERIZATION  02/10/2004   mid L anterior descending PCI with 30.13 Cypher stent, resulting in a 90% lesion to a 0% residual   CARDIAC CATHETERIZATION  06/09/2004   30% stenosis in RCA; widely patent L anterior descending   CORONARY STENT INTERVENTION N/A 04/02/2018   Procedure: CORONARY STENT INTERVENTION;  Surgeon: Lorretta Harp, MD;  Location: [place] [facility];  Service: Cardiovascular;  Laterality: N/A;   LEFT HEART CATH AND CORONARY ANGIOGRAPHY N/A 04/02/2018   Procedure: LEFT HEART CATH AND CORONARY ANGIOGRAPHY;  Surgeon: Lorretta Harp, MD;  Location: [place] [facility];  Service: Cardiovascular;  Laterality: N/A;   TONSILLECTOMY     Patient Active Problem List   Diagnosis  Date Noted   CAD (coronary artery disease) 04/02/2018   Coronary artery disease 11/08/2013   Essential hypertension 11/08/2013   Hyperlipidemia 11/08/2013    REFERRING DIAG:  R20.2 (ICD-10-CM) - Tingling in extremities   THERAPY DIAG:  Muscle weakness (generalized)  Numbness and tingling  Cramp and spasm  Rationale for Evaluation and Treatment Rehabilitation  PERTINENT HISTORY: CAD s/p stent placement, HTN   PRECAUTIONS: none  SUBJECTIVE:  SUBJECTIVE STATEMENT: I was not sore after the needling.  PAIN:  Are you having pain? No current pain, tingling/numbness rating of 5/10. I bought a roller for home use but I need to review what to do.    OBJECTIVE: (objective measures completed at initial evaluation unless otherwise dated)  DIAGNOSTIC FINDINGS:  N/a   PATIENT SURVEYS:  Eval:  Quick Dash 25%   SCREENING FOR RED FLAGS: Bowel or bladder incontinence: No Spinal tumors: No Cauda equina syndrome: No Compression fracture: No Abdominal aneurysm: No   COGNITION: Overall cognitive status: Within functional limits for tasks assessed                          SENSATION: Numbness and tingling down left arm and right lateral thigh   MUSCLE LENGTH: Hamstrings tightness noted bilat   POSTURE: rounded shoulders   PALPATION: Pt reports tenderness to palpation along right lateral thigh   CERVICAL ROM:    Reports some pain with cervical rotation to right   LOWER EXTREMITY ROM:      WFL   LOWER EXTREMITY MMT:     WFL   UPPER EXTREMITY MMT: WFL except left external rotators 4+/5 Grip strength: right 115 lbs, left 115 lbs   LUMBAR SPECIAL TESTS:  Slump test: Negative   GAIT: Distance walked: >500 ft Assistive device utilized: None Level of assistance: Complete  Independence Comments: Pt reports that he is still able to go running with his wife   TODAY'S TREATMENT:   02/14/22: UBE level 2 3x3 min each direction with PTA  present to discuss status Supine with head on blue ball: chin tuck/head press 5 sec 6x, hold 3# for scissor arms 10x, butterfly with 3# 10x, blue band horizontal abd 15x, blue band diagonals 10x Bil Sidelyling open book stretch 10x Bil Supine on foam roll: decompressive breathing 30 sec f/b angl arms 10x f/b horizontal shoulder abd/add 10x,  LT sidelying shoulder Ext rot 3# 2x15 Mechanical Traction 10 min 19#/5# 60/20     02/07/2022: UBE level 1.5 x3 min each direction with PT present to discuss status Standing rows and shoulder extension with green tband 2x10 each  Seated on blue pball with 4 way pelvic tilts x20 each Seated on blue pball with pelvic circles CW and CCW x20 each Standing hamstring stretch at stairs 2x20 sec bilat Trigger Point Dry-Needling  Treatment instructions: Expect mild to moderate muscle soreness. S/S of pneumothorax if dry needled over a lung field, and to seek immediate medical attention should they occur. Patient verbalized understanding of these instructions and education. Patient Consent Given: Yes Education handout provided: Yes Muscles treated: Bilateral cervical multifidi, right upper trap, right rhomboid, right lat, right thoracic multifidi, right lumbar multifidi Electrical stimulation performed: No Parameters: N/A Treatment response/outcome: Utilized skilled palpation to identify trigger points.  Able to palpate twitch response and muscle elongation following. Manual Therapy:  Soft tissue mobilization following and during dry needling to ensure myofascial glide and encourage further tissue elongation.   01/31/22:  Supine on foam roll: 2 min diaphragmatic breathing, then blue band scap unattached series: 2x10 with slight head press/chin tuck Thoracic extension on roll 2x10 Mechanical  traction: 15 min 19#/5 #intermittent 60/20. Pillows under arms for support. Blue band shoulder ext rotation 10x: good return demo for home  01/24/22: Arm bike reverse: L 1.3 5 min 5 min Supine head on blue physioball; chink/head press 5 sec hold, VC for chest open/scap down, 6x: add 3#  scissor arms 10x, flies 10x, then open/close door: (pronation/sup) Thoracic extension over ball 10x: 2 different segments 10x each Mechanical traction: 15 min 18#/5 #intermittent 60/20. Pillows under arms for support.      PATIENT EDUCATION:  Education details: Issued HEP and provided with green and blue theraband Person educated: Patient Education method: Explanation, Demonstration, and Handouts Education comprehension: verbalized understanding and returned demonstration   HOME EXERCISE PROGRAM: Access Code: [email or web address] URL: [email or web address] Date: 01/10/2022 Prepared by: Shelby Dubin Menke   Exercises - Standing Median Nerve Glide  - 1 x daily - 7 x weekly - 2 sets - 10 reps - Standing Ulnar Nerve Glide  - 1 x daily - 7 x weekly - 2 sets - 10 reps - Standing Radial Nerve Glide  - 1 x daily - 7 x weekly - 2 sets - 10 reps - Doorway Pec Stretch at 60 Elevation  - 1 x daily - 7 x weekly - 1 sets - 2 reps - 20 sec hold - Shoulder External Rotation and Scapular Retraction with Resistance  - 1 x daily - 7 x weekly - 2 sets - 10 reps - Seated Levator Scapulae Stretch  - 1 x daily - 7 x weekly - 2 sets - 3 reps - 20 sec hold - Seated Upper Trapezius Stretch  - 1 x daily - 7 x weekly - 2 sets - 3 reps - 20 sec hold - Standing Hamstring Stretch with Step  - 1 x daily - 7 x weekly - 2 sets - 3 reps - 20 sec hold   ASSESSMENT:   CLINICAL IMPRESSION:Pt continues to do well overall. He reports looking over his shoulders continues to improve. Pt was able to perform cervical stabilization with resistance in the UE with no pain/radicular symptoms. Increased pull on traction to 19# which pt tolerated well.     OBJECTIVE IMPAIRMENTS: decreased strength, impaired sensation, impaired UE functional use, and pain.    ACTIVITY LIMITATIONS: carrying   PARTICIPATION LIMITATIONS: community activity and occupation   PERSONAL FACTORS: Time since onset of injury/illness/exacerbation and 1 comorbidity: CAD with stent placement  are also affecting patient's functional outcome.    REHAB POTENTIAL: Good   CLINICAL DECISION MAKING: Stable/uncomplicated   EVALUATION COMPLEXITY: Low     GOALS: Goals reviewed with patient? Yes   SHORT TERM GOALS: Target date: 01/31/2022   Pt will be independent with initial HEP. Baseline: Goal status: Goal met 01/24/22   2.  Pt will report no increase in pain when looking over his shoulder when driving. Baseline:  Goal status: Pain is 30% less looking over shoulder.     LONG TERM GOALS: Target date: 03/05/2023   Pt will be independent with advanced HEP. Baseline:  Goal status: IN PROGRESS   2.  Pt will increase right shoulder ER strength to 5/5 to allow him to perform all desired hobbies and activities. Baseline:  Goal status: INITIAL   3.  Pt will report at least a 60% improvement in numbness and tingling symptoms. Baseline:  Goal status: INITIAL   4.  Pt will report ability to drive without increased numbness and tingling. Baseline:  Goal status: INITIAL     PLAN:   PT FREQUENCY: 1x/week   PT DURATION: 8 weeks   PLANNED INTERVENTIONS: Therapeutic exercises, Therapeutic activity, Neuromuscular re-education, Balance training, Gait training, Patient/Family education, Self Care, Joint mobilization, Joint manipulation, Stair training, Aquatic Therapy, Dry Needling, Electrical stimulation, Spinal manipulation, Spinal mobilization, Cryotherapy, Moist heat, Taping, Traction, Ultrasound, Ionotophoresis 7m/ml  Dexamethasone, Manual therapy, and Re-evaluation.   PLAN FOR NEXT SESSION: Continue with cervical stabilization exercises, mechanical traction,  Assess response to dry needling      Nimsi Males, PTA 02/14/2022, 8:36 AM  [facility] [address], [place], [place] [address] Phone # [phone] Fax [phone]

## 2022-02-21 ENCOUNTER — Encounter: Payer: Self-pay | Admitting: Rehabilitative and Restorative Service Providers"

## 2022-02-21 ENCOUNTER — Ambulatory Visit: Payer: No Typology Code available for payment source | Admitting: Rehabilitative and Restorative Service Providers"

## 2022-02-21 DIAGNOSIS — R202 Paresthesia of skin: Secondary | ICD-10-CM

## 2022-02-21 DIAGNOSIS — M6281 Muscle weakness (generalized): Secondary | ICD-10-CM | POA: Diagnosis not present

## 2022-02-21 DIAGNOSIS — R252 Cramp and spasm: Secondary | ICD-10-CM

## 2022-02-21 NOTE — Therapy (Signed)
OUTPATIENT PHYSICAL THERAPY TREATMENT NOTE   Patient Name: Phillip Mercado MRN: [id number] DOB:August 06, 1960, 61 y.o., male Today's Date: 02/21/2022  PCP: [name], MD  REFERRING PROVIDER: [name], MD   END OF SESSION:   PT End of Session - 02/21/22 0805     Visit Number 7    Date for PT Re-Evaluation 03/04/22    Authorization Type U[employer]    PT Start Time 0800    PT Stop Time 0840    PT Time Calculation (min) 40 min    Activity Tolerance Patient tolerated treatment well    Behavior During Therapy [facility] for tasks assessed/performed                Past Medical History:  Diagnosis Date   CAD (coronary artery disease) 01/08/2010   R/S MV - EF 58%; normal pattern of perfusion in all regions; no significant wall abnormalities noted; exercise capacity 13 METS, EKG negative for ischemia   Claudication ([place]) 11/29/2005   bilateral ABIs no evidence of arterial insufficiency; bilateral PVRs, normal values; bilateral lower extremities demonstrate normal values with no suggestion of significant diameter reduction, dissection, aneurysmal dilation or anormality   Hyperlipidemia    Hypertension    Pneumonia    history of pna 3 times    Past Surgical History:  Procedure Laterality Date   CARDIAC CATHETERIZATION  02/10/2004   mid L anterior descending PCI with 30.13 Cypher stent, resulting in a 90% lesion to a 0% residual   CARDIAC CATHETERIZATION  06/09/2004   30% stenosis in RCA; widely patent L anterior descending   CORONARY STENT INTERVENTION N/A 04/02/2018   Procedure: CORONARY STENT INTERVENTION;  Surgeon: Lorretta Harp, MD;  Location: [place] [facility];  Service: Cardiovascular;  Laterality: N/A;   LEFT HEART CATH AND CORONARY ANGIOGRAPHY N/A 04/02/2018   Procedure: LEFT HEART CATH AND CORONARY ANGIOGRAPHY;  Surgeon: Lorretta Harp, MD;  Location: [place] [facility];  Service: Cardiovascular;  Laterality: N/A;   TONSILLECTOMY     Patient Active Problem List   Diagnosis  Date Noted   CAD (coronary artery disease) 04/02/2018   Coronary artery disease 11/08/2013   Essential hypertension 11/08/2013   Hyperlipidemia 11/08/2013    REFERRING DIAG:  R20.2 (ICD-10-CM) - Tingling in extremities   THERAPY DIAG:  Muscle weakness (generalized)  Numbness and tingling  Cramp and spasm  Rationale for Evaluation and Treatment Rehabilitation  PERTINENT HISTORY: CAD s/p stent placement, HTN   PRECAUTIONS: none  SUBJECTIVE:  SUBJECTIVE STATEMENT: Pt reports that he is still having the tingling, but can tell that he is some better than when he first started.  States that he has been making modifications for improved posture that have been helping.  PAIN:  Are you having pain? No current pain, tingling/numbness rating of 2-5/10. I bought a roller for home use but I need to review what to do.    OBJECTIVE: (objective measures completed at initial evaluation unless otherwise dated)  DIAGNOSTIC FINDINGS:  N/a   PATIENT SURVEYS:  Eval:  Quick Dash 25 02/21/2022:  Quick Dash 20.45    SCREENING FOR RED FLAGS: Bowel or bladder incontinence: No Spinal tumors: No Cauda equina syndrome: No Compression fracture: No Abdominal aneurysm: No   COGNITION: Overall cognitive status: Within functional limits for tasks assessed                          SENSATION: Numbness and tingling down left arm and right lateral thigh   MUSCLE LENGTH: Hamstrings tightness noted bilat   POSTURE: rounded shoulders   PALPATION: Pt reports tenderness to palpation along right lateral thigh   CERVICAL ROM:    Reports some pain with cervical rotation to right   LOWER EXTREMITY ROM:      WFL   LOWER EXTREMITY MMT:     WFL   UPPER EXTREMITY MMT: WFL except left external rotators 4+/5 Grip  strength: right 115 lbs, left 115 lbs   LUMBAR SPECIAL TESTS:  Slump test: Negative   GAIT: Distance walked: >500 ft Assistive device utilized: None Level of assistance: Complete Independence Comments: Pt reports that he is still able to go running with his wife   TODAY'S TREATMENT:   02/21/2022: UBE level 2.0 x3 min each direction with PT present to discuss status Trigger Point Dry-Needling  Treatment instructions: Expect mild to moderate muscle soreness. S/S of pneumothorax if dry needled over a lung field, and to seek immediate medical attention should they occur. Patient verbalized understanding of these instructions and education. Patient Consent Given: Yes Education handout provided: Yes Muscles treated: Bilateral cervical multifidi, bilat upper trap, bilat thoracic multifidi, bilat lumbar multifidi Electrical stimulation performed: No Parameters: N/A Treatment response/outcome: Utilized skilled palpation to identify trigger points.  Able to palpate twitch response and muscle elongation following. Manual Therapy:  Soft tissue mobilization following and during dry needling to ensure myofascial glide and encourage further tissue elongation. Supine with head on blue ball: chin tuck/head press 5 sec 6x, hold 3# for scissor arms 10x, butterfly with 3# 10x, blue band diagonals 10x Bil, shoulder ER with blue tband x15.   02/14/22: UBE level 2 3x3 min each direction with PTA  present to discuss status Supine with head on blue ball: chin tuck/head press 5 sec 6x, hold 3# for scissor arms 10x, butterfly with 3# 10x, blue band horizontal abd 15x, blue band diagonals 10x Bil Sidelyling open book stretch 10x Bil Supine on foam roll: decompressive breathing 30 sec f/b angl arms 10x f/b horizontal shoulder abd/add 10x,  LT sidelying shoulder Ext rot 3# 2x15 Mechanical Traction 10 min 19#/5# 60/20     02/07/2022: UBE level 1.5 x3 min each direction with PT present to discuss  status Standing rows and shoulder extension with green tband 2x10 each  Seated on blue pball with 4 way pelvic tilts x20 each Seated on blue pball with pelvic circles CW and CCW x20 each Standing hamstring stretch at stairs 2x20 sec bilat  Trigger Point Dry-Needling  Treatment instructions: Expect mild to moderate muscle soreness. S/S of pneumothorax if dry needled over a lung field, and to seek immediate medical attention should they occur. Patient verbalized understanding of these instructions and education. Patient Consent Given: Yes Education handout provided: Yes Muscles treated: Bilateral cervical multifidi, right upper trap, right rhomboid, right lat, right thoracic multifidi, right lumbar multifidi Electrical stimulation performed: No Parameters: N/A Treatment response/outcome: Utilized skilled palpation to identify trigger points.  Able to palpate twitch response and muscle elongation following. Manual Therapy:  Soft tissue mobilization following and during dry needling to ensure myofascial glide and encourage further tissue elongation.       PATIENT EDUCATION:  Education details: Issued HEP and provided with green and blue theraband Person educated: Patient Education method: Explanation, Demonstration, and Handouts Education comprehension: verbalized understanding and returned demonstration   HOME EXERCISE PROGRAM: Access Code: [email or web address] URL: [email or web address] Date: 01/10/2022 Prepared by: Shelby Dubin Zaela Graley   Exercises - Standing Median Nerve Glide  - 1 x daily - 7 x weekly - 2 sets - 10 reps - Standing Ulnar Nerve Glide  - 1 x daily - 7 x weekly - 2 sets - 10 reps - Standing Radial Nerve Glide  - 1 x daily - 7 x weekly - 2 sets - 10 reps - Doorway Pec Stretch at 60 Elevation  - 1 x daily - 7 x weekly - 1 sets - 2 reps - 20 sec hold - Shoulder External Rotation and Scapular Retraction with Resistance  - 1 x daily - 7 x weekly - 2 sets - 10 reps - Seated  Levator Scapulae Stretch  - 1 x daily - 7 x weekly - 2 sets - 3 reps - 20 sec hold - Seated Upper Trapezius Stretch  - 1 x daily - 7 x weekly - 2 sets - 3 reps - 20 sec hold - Standing Hamstring Stretch with Step  - 1 x daily - 7 x weekly - 2 sets - 3 reps - 20 sec hold   ASSESSMENT:   CLINICAL IMPRESSION: Mr Geise presents to skilled PT with making some progress towards goals.  States that he is able to look over his shoulder during driving with decreased symptoms.  Patient continues to progress with improved postural awareness, especially during driving.  Patient states that he is going to try the inversion table that his son in law has to see if he has similar relief of symptoms as when using traction.  Patient with reported decreased pain and increased looseness following dry needling.  Patient to be reassessed next week to determine DC with HEP vs continued PT.   OBJECTIVE IMPAIRMENTS: decreased strength, impaired sensation, impaired UE functional use, and pain.    ACTIVITY LIMITATIONS: carrying   PARTICIPATION LIMITATIONS: community activity and occupation   PERSONAL FACTORS: Time since onset of injury/illness/exacerbation and 1 comorbidity: CAD with stent placement  are also affecting patient's functional outcome.    REHAB POTENTIAL: Good   CLINICAL DECISION MAKING: Stable/uncomplicated   EVALUATION COMPLEXITY: Low     GOALS: Goals reviewed with patient? Yes   SHORT TERM GOALS: Target date: 01/31/2022   Pt will be independent with initial HEP. Baseline: Goal status: Goal met 01/24/22   2.  Pt will report no increase in pain when looking over his shoulder when driving. Baseline:  Goal status: Pain is 30% less looking over shoulder.     LONG TERM GOALS: Target date: 03/05/2023   Pt will be independent  with advanced HEP. Baseline:  Goal status: IN PROGRESS   2.  Pt will increase right shoulder ER strength to 5/5 to allow him to perform all desired hobbies and  activities. Baseline:  Goal status: INITIAL   3.  Pt will report at least a 60% improvement in numbness and tingling symptoms. Baseline:  Goal status: IN PROGRESS   4.  Pt will report ability to drive without increased numbness and tingling. Baseline:  Goal status: IN PROGRESS     PLAN:   PT FREQUENCY: 1x/week   PT DURATION: 8 weeks   PLANNED INTERVENTIONS: Therapeutic exercises, Therapeutic activity, Neuromuscular re-education, Balance training, Gait training, Patient/Family education, Self Care, Joint mobilization, Joint manipulation, Stair training, Aquatic Therapy, Dry Needling, Electrical stimulation, Spinal manipulation, Spinal mobilization, Cryotherapy, Moist heat, Taping, Traction, Ultrasound, Ionotophoresis 1m/ml Dexamethasone, Manual therapy, and Re-evaluation.   PLAN FOR NEXT SESSION: Continue with cervical stabilization exercises, mechanical traction, Assess response to dry needling      SJuel Burrow PT 02/21/2022, 9:05 AM  BB[facility] LLC[address] [place] [address]Phone # [phone] [phone]

## 2022-03-01 ENCOUNTER — Encounter: Payer: Self-pay | Admitting: Rehabilitative and Restorative Service Providers"

## 2022-03-01 ENCOUNTER — Ambulatory Visit: Payer: No Typology Code available for payment source | Admitting: Rehabilitative and Restorative Service Providers"

## 2022-03-01 DIAGNOSIS — M6281 Muscle weakness (generalized): Secondary | ICD-10-CM | POA: Diagnosis not present

## 2022-03-01 DIAGNOSIS — R200 Anesthesia of skin: Secondary | ICD-10-CM

## 2022-03-01 DIAGNOSIS — R252 Cramp and spasm: Secondary | ICD-10-CM

## 2022-03-01 NOTE — Therapy (Signed)
OUTPATIENT PHYSICAL THERAPY TREATMENT NOTE AND DISCHARGE SUMMARY   Patient Name: Phillip Mercado MRN: [id number] DOB:12-Oct-1960, 61 y.o., male Today's Date: 03/01/2022  PCP: Phillip Pepper, MD  REFERRING PROVIDER: [name], MD   END OF SESSION:   PT End of Session - 03/01/22 0801     Visit Number 8    Date for PT Re-Evaluation 03/04/22    Authorization Type UHC    PT Start Time 0800    PT Stop Time 0840    PT Time Calculation (min) 40 min    Activity Tolerance Patient tolerated treatment well    Behavior During Therapy [facility] for tasks assessed/performed                Past Medical History:  Diagnosis Date   CAD (coronary artery disease) 01/08/2010   R/S MV - EF 58%; normal pattern of perfusion in all regions; no significant wall abnormalities noted; exercise capacity 13 METS, EKG negative for ischemia   Claudication ([place]) 11/29/2005   bilateral ABIs no evidence of arterial insufficiency; bilateral PVRs, normal values; bilateral lower extremities demonstrate normal values with no suggestion of significant diameter reduction, dissection, aneurysmal dilation or anormality   Hyperlipidemia    Hypertension    Pneumonia    history of pna 3 times    Past Surgical History:  Procedure Laterality Date   CARDIAC CATHETERIZATION  02/10/2004   mid L anterior descending PCI with 30.13 Cypher stent, resulting in a 90% lesion to a 0% residual   CARDIAC CATHETERIZATION  06/09/2004   30% stenosis in RCA; widely patent L anterior descending   CORONARY STENT INTERVENTION N/A 04/02/2018   Procedure: CORONARY STENT INTERVENTION;  Surgeon: Lorretta Harp, MD;  Location: [place] [facility];  Service: Cardiovascular;  Laterality: N/A;   LEFT HEART CATH AND CORONARY ANGIOGRAPHY N/A 04/02/2018   Procedure: LEFT HEART CATH AND CORONARY ANGIOGRAPHY;  Surgeon: Lorretta Harp, MD;  Location: [place] [facility];  Service: Cardiovascular;  Laterality: N/A;   TONSILLECTOMY     Patient Active  Problem List   Diagnosis Date Noted   CAD (coronary artery disease) 04/02/2018   Coronary artery disease 11/08/2013   Essential hypertension 11/08/2013   Hyperlipidemia 11/08/2013    REFERRING DIAG:  R20.2 (ICD-10-CM) - Tingling in extremities   THERAPY DIAG:  Muscle weakness (generalized)  Numbness and tingling  Cramp and spasm  Rationale for Evaluation and Treatment Rehabilitation  PERTINENT HISTORY: CAD s/p stent placement, HTN   PRECAUTIONS: none  SUBJECTIVE:  SUBJECTIVE STATEMENT: Pt reports that "it seems like it is getting better, I know the pressure between my shoulder blades is better."  PAIN:  Are you having pain? No current pain, tingling/numbness rating of 2-5/10. I bought a roller for home use but I need to review what to do.    OBJECTIVE: (objective measures completed at initial evaluation unless otherwise dated)  DIAGNOSTIC FINDINGS:  N/a   PATIENT SURVEYS:  Eval:  Quick Dash 25 02/21/2022:  Quick Dash 20.45    SCREENING FOR RED FLAGS: Bowel or bladder incontinence: No Spinal tumors: No Cauda equina syndrome: No Compression fracture: No Abdominal aneurysm: No   COGNITION: Overall cognitive status: Within functional limits for tasks assessed                          SENSATION: Numbness and tingling down left arm and right lateral thigh   MUSCLE LENGTH: Hamstrings tightness noted bilat   POSTURE: rounded shoulders   PALPATION: Pt reports tenderness to palpation along right lateral thigh   CERVICAL ROM:    Reports some pain with cervical rotation to right   LOWER EXTREMITY ROM:      WFL   LOWER EXTREMITY MMT:     WFL   UPPER EXTREMITY MMT: Eval: WFL except left external rotators 4+/5 Grip strength: right 115 lbs, left 115 lbs  03/01/2022: Bilateral  shoulder external rotators 5/5   LUMBAR SPECIAL TESTS:  Slump test: Negative   GAIT: Distance walked: >500 ft Assistive device utilized: None Level of assistance: Complete Independence Comments: Pt reports that he is still able to go running with his wife   TODAY'S TREATMENT:   03/01/2022: Nustep level 3 x6 min with PT present to discuss status Supine with head one pillow: chin tuck/head press 5 sec 6x, hold 3# for scissor arms 10x, butterfly with 3# 10x, blue band horizontal abd 15x, blue band diagonals 10x Bil Sidelyling open book stretch 10x Bil Supine posterior pelvic tilt 2x10 Supine clamshell with red loop 2x10 Supine posterior pelvic tilt with march and red loop around knees 2x10 bilat Supine LE/UE red pball pass x10 Supine lower trunk rotation x10 Seated lat pull 100# 2x10   02/21/2022: UBE level 2.0 x3 min each direction with PT present to discuss status Trigger Point Dry-Needling  Treatment instructions: Expect mild to moderate muscle soreness. S/S of pneumothorax if dry needled over a lung field, and to seek immediate medical attention should they occur. Patient verbalized understanding of these instructions and education. Patient Consent Given: Yes Education handout provided: Yes Muscles treated: Bilateral cervical multifidi, bilat upper trap, bilat thoracic multifidi, bilat lumbar multifidi Electrical stimulation performed: No Parameters: N/A Treatment response/outcome: Utilized skilled palpation to identify trigger points.  Able to palpate twitch response and muscle elongation following. Manual Therapy:  Soft tissue mobilization following and during dry needling to ensure myofascial glide and encourage further tissue elongation. Supine with head on blue ball: chin tuck/head press 5 sec 6x, hold 3# for scissor arms 10x, butterfly with 3# 10x, blue band diagonals 10x Bil, shoulder ER with blue tband x15.   02/14/22: UBE level 2 3x3 min each direction with PTA   present to discuss status Supine with head on blue ball: chin tuck/head press 5 sec 6x, hold 3# for scissor arms 10x, butterfly with 3# 10x, blue band horizontal abd 15x, blue band diagonals 10x Bil Sidelyling open book stretch 10x Bil Supine on foam roll: decompressive breathing 30 sec f/b  angl arms 10x f/b horizontal shoulder abd/add 10x,  LT sidelying shoulder Ext rot 3# 2x15 Mechanical Traction 10 min 19#/5# 60/20          PATIENT EDUCATION:  Education details: Issued HEP and provided with green and blue theraband Person educated: Patient Education method: Explanation, Demonstration, and Handouts Education comprehension: verbalized understanding and returned demonstration   HOME EXERCISE PROGRAM: Access Code: [email or web address] URL: [email or web address] Date: 03/01/2022 Prepared by: Shelby Dubin Kortney Schoenfelder  Exercises - Standing Median Nerve Glide  - 1 x daily - 7 x weekly - 2 sets - 10 reps - Standing Ulnar Nerve Glide  - 1 x daily - 7 x weekly - 2 sets - 10 reps - Standing Radial Nerve Glide  - 1 x daily - 7 x weekly - 2 sets - 10 reps - Doorway Pec Stretch at 60 Elevation  - 1 x daily - 7 x weekly - 1 sets - 2 reps - 20 sec hold - Shoulder External Rotation and Scapular Retraction with Resistance  - 1 x daily - 7 x weekly - 2 sets - 10 reps - Seated Levator Scapulae Stretch  - 1 x daily - 7 x weekly - 2 sets - 3 reps - 20 sec hold - Seated Upper Trapezius Stretch  - 1 x daily - 7 x weekly - 2 sets - 3 reps - 20 sec hold - Standing Hamstring Stretch with Step  - 1 x daily - 7 x weekly - 2 sets - 3 reps - 20 sec hold - Supine Lower Trunk Rotation  - 1 x daily - 7 x weekly - 2 sets - 10 reps - Sidelying Thoracic Rotation with Open Book  - 1 x daily - 7 x weekly - 1 sets - 10 reps - Thoracic Foam Roll Mobilization Backstroke  - 1 x daily - 7 x weekly - 2 sets - 10 reps - [place] on Foam Roll  - 1 x daily - 7 x weekly - 2 sets - 10 reps - Isometric Dead Bug with Swiss Ball  - 1 x  daily - 7 x weekly - 2 sets - 10 reps - Supine Chin Tuck  - 1 x daily - 7 x weekly - 2 sets - 10 reps - Supine Shoulder Horizontal Abduction with Resistance  - 1 x daily - 7 x weekly - 2 sets - 10 reps - Standing Shoulder Diagonal Horizontal Abduction 60/120 Degrees with Resistance  - 1 x daily - 7 x weekly - 2 sets - 10 reps   ASSESSMENT:   CLINICAL IMPRESSION: Phillip Mercado presents to skilled PT with reporting decreased pain and numbness/tingling.  Patient states that with his increased attention to posture and taking his wallet out from his back pocket when driving, he is able to turn to look over his shoulder and drive community distances without increased numbness and tingling.  Patient has improved on his shoulder external rotator strength.  Patient has met all goals and is ready to discharge from skilled outpatient PT at this time to continue HEP and going to exercise at the gym.   OBJECTIVE IMPAIRMENTS: decreased strength, impaired sensation, impaired UE functional use, and pain.    ACTIVITY LIMITATIONS: carrying   PARTICIPATION LIMITATIONS: community activity and occupation   PERSONAL FACTORS: Time since onset of injury/illness/exacerbation and 1 comorbidity: CAD with stent placement  are also affecting patient's functional outcome.    REHAB POTENTIAL: Good   CLINICAL DECISION MAKING: Stable/uncomplicated   EVALUATION COMPLEXITY: Low  GOALS: Goals reviewed with patient? Yes   SHORT TERM GOALS: Target date: 01/31/2022   Pt will be independent with initial HEP. Baseline: Goal status: Goal met 01/24/22   2.  Pt will report no increase in pain when looking over his shoulder when driving. Baseline:  Goal status: MET     LONG TERM GOALS: Target date: 03/05/2023   Pt will be independent with advanced HEP. Baseline:  Goal status: MET   2.  Pt will increase right shoulder ER strength to 5/5 to allow him to perform all desired hobbies and activities. Baseline:  Goal  status: MET   3.  Pt will report at least a 60% improvement in numbness and tingling symptoms. Baseline:  Goal status: MET   4.  Pt will report ability to drive without increased numbness and tingling. Baseline:  Goal status: MET     PLAN:   PT FREQUENCY: 1x/week   PT DURATION: 8 weeks   PLANNED INTERVENTIONS: Therapeutic exercises, Therapeutic activity, Neuromuscular re-education, Balance training, Gait training, Patient/Family education, Self Care, Joint mobilization, Joint manipulation, Stair training, Aquatic Therapy, Dry Needling, Electrical stimulation, Spinal manipulation, Spinal mobilization, Cryotherapy, Moist heat, Taping, Traction, Ultrasound, Ionotophoresis 41m/ml Dexamethasone, Manual therapy, and Re-evaluation.   PLAN FOR NEXT SESSION: Continue with cervical stabilization exercises, mechanical traction, Assess response to dry needling   PHYSICAL THERAPY DISCHARGE SUMMARY  Patient agrees to discharge. Patient goals were met. Patient is being discharged due to meeting the stated rehab goals.       SJuel Burrow PT 03/01/2022, 8:53 AM  [facility] [place] ParkG[place] [address]Phone # [phone] [phone]

## 2022-03-22 ENCOUNTER — Other Ambulatory Visit: Payer: Self-pay | Admitting: Cardiovascular Disease

## 2022-04-29 ENCOUNTER — Ambulatory Visit: Payer: No Typology Code available for payment source | Admitting: Cardiovascular Disease

## 2022-05-04 ENCOUNTER — Other Ambulatory Visit: Payer: Self-pay | Admitting: Cardiovascular Disease

## 2022-05-16 ENCOUNTER — Other Ambulatory Visit: Payer: Self-pay | Admitting: Cardiovascular Disease

## 2022-07-03 ENCOUNTER — Other Ambulatory Visit: Payer: Self-pay | Admitting: Cardiovascular Disease

## 2022-07-19 ENCOUNTER — Ambulatory Visit
Payer: No Typology Code available for payment source | Attending: Cardiovascular Disease | Admitting: Cardiovascular Disease

## 2022-07-19 ENCOUNTER — Encounter: Payer: Self-pay | Admitting: Cardiovascular Disease

## 2022-07-19 ENCOUNTER — Other Ambulatory Visit: Payer: Self-pay | Admitting: Cardiovascular Disease

## 2022-07-19 VITALS — BP 145/82 | HR 61 | Ht 72.0 in | Wt 232.0 lb

## 2022-07-19 DIAGNOSIS — E782 Mixed hyperlipidemia: Secondary | ICD-10-CM | POA: Diagnosis not present

## 2022-07-19 DIAGNOSIS — I2510 Atherosclerotic heart disease of native coronary artery without angina pectoris: Secondary | ICD-10-CM | POA: Diagnosis not present

## 2022-07-19 DIAGNOSIS — I10 Essential (primary) hypertension: Secondary | ICD-10-CM

## 2022-07-19 MED ORDER — EZETIMIBE 10 MG PO TABS: 10.0000 mg | ORAL_TABLET | Freq: Every day | ORAL | 3 refills | Status: DC

## 2022-07-19 MED ORDER — NITROGLYCERIN 0.4 MG SL SUBL: 0.4000 mg | SUBLINGUAL_TABLET | SUBLINGUAL | 3 refills | Status: AC | PRN

## 2022-07-19 MED ORDER — CLOPIDOGREL BISULFATE 75 MG PO TABS: 75.0000 mg | ORAL_TABLET | Freq: Every day | ORAL | 3 refills | Status: DC

## 2022-07-19 MED ORDER — SIMVASTATIN 80 MG PO TABS: 80.0000 mg | ORAL_TABLET | Freq: Every day | ORAL | 3 refills | Status: DC

## 2022-07-19 NOTE — Assessment & Plan Note (Signed)
History of CAD status post PCI and stenting of the LAD with a Cypher drug-eluting stent December 2005.  He had a 30% dominant RCA stenosis.  I performed catheterization on him 04/02/2018 via the femoral approach revealing patent proximal LAD stent with high-grade mid and distal RCA stenosis.  Placed 2 drug-eluting stents to his RCA.  He has done well since and is completely asymptomatic.

## 2022-07-19 NOTE — Patient Instructions (Signed)
Medication Instructions:  Your physician recommends that you continue on your current medications as directed. Please refer to the Current Medication list given to you today.  *If you need a refill on your cardiac medications before your next appointment, please call your pharmacy*   Follow-Up: At [facility], you and your health needs are our priority.  As part of our continuing mission to provide you with exceptional heart care, we have created designated Provider Care Teams.  These Care Teams include your primary Cardiologist (physician) and Advanced Practice Providers (APPs -  Physician Assistants and Nurse Practitioners) who all work together to provide you with the care you need, when you need it.  We recommend signing up for the patient portal called "MyChart".  Sign up information is provided on this After Visit Summary.  MyChart is used to connect with patients for Virtual Visits (Telemedicine).  Patients are able to view lab/test results, encounter notes, upcoming appointments, etc.  Non-urgent messages can be sent to your provider as well.   To learn more about what you can do with MyChart, go to [email or web address].    Your next appointment:   12 month(s)  Provider:   Jonathan Berry, MD    

## 2022-07-19 NOTE — Progress Notes (Signed)
07/19/2022 [place]   03-03-1961  [id number]  Primary Physician Farris Has, MD Primary Cardiologist: Runell Gess MD Nicholes Calamity, [place]  HPI:  Phillip Mercado is a 62 y.o.  mild to moderately overweight married Caucasian male father of 5 children, grandfather of 7 grandchildren who I last saw in the office 06/03/2020.Marland Kitchen  He works as a Psychologist, sport and exercise.  One of his sons started medical school getting his DO degree.  He has a history of CAD status post LAD PCI and stenting with a Cypher drug-eluting stent of December 2005. He had 30% dominant RCA stenosis and normal in function. His other problems include hypertension and hyperlipidemia.   Because of new onset chest discomfort and fatigue while walking his dog I did get a stress test on him 03/14/2018 which was abnormal.  This led to a heart cath 04/02/2018 with right femoral approach revealing a patent proximal LAD stent placed in 2005, high-grade mid and distal dominant RCA stenoses, and normal LV function.  I placed 2 drug-eluting stents in his RCA.  He was discharged home the following day.  He has had no recurrent symptoms other than what sounds like left upper extremity neuropathic pain.     Since I saw him in the office 2 years ago he continues to do well.  He is not as active as he used to be.  He denies chest pain or shortness of breath.  His recent lipid profile revealed an increase performed 12/03/2021 revealed total cholesterol 147, LDL 77 and HDL 47 on simvastatin 80 mg a day as well as Zetia.   Current Meds  Medication Sig   Ascorbic Acid (VITAMIN C) 100 MG tablet Take 100 mg by mouth daily.   aspirin EC 81 MG tablet Take 81 mg by mouth at bedtime.    Cholecalciferol (VITAMIN D-3 PO) Take 1 capsule by mouth daily.   Coenzyme Q10 (CO Q 10) 100 MG CAPS Take 100 mg by mouth daily.    Coenzyme Q10-Vitamin E (QUNOL ULTRA COQ10 PO) Take 1,000 mg by mouth 3 (three) times a week. LIQUID    Multiple Vitamin (MULTIVITAMIN WITH MINERALS) TABS tablet Take 1 tablet by mouth daily.   [DISCONTINUED] clopidogrel (PLAVIX) 75 MG tablet TAKE 1 TABLET BY MOUTH EVERY DAY   [DISCONTINUED] ezetimibe (ZETIA) 10 MG tablet Take 1 tablet (10 mg total) by mouth daily. Pt. Will need to make an appointment in order to receive future refills.   [DISCONTINUED] simvastatin (ZOCOR) 80 MG tablet Take 1 tablet (80 mg total) by mouth daily at 6 PM.     Allergies  Allergen Reactions   Crestor [Rosuvastatin Calcium]     Muscle pains   Vytorin [Ezetimibe-Simvastatin] Other (See Comments)    Increased liver enzymes    Social History   Socioeconomic History   Marital status: Married    Spouse name: Not on file   Number of children: Not on file   Years of education: Not on file   Highest education level: Not on file  Occupational History   Not on file  Tobacco Use   Smoking status: Never   Smokeless tobacco: Never  Vaping Use   Vaping Use: Never used  Substance and Sexual Activity   Alcohol use: No   Drug use: No   Sexual activity: Not on file  Other Topics Concern   Not on file  Social History Narrative   Not on file  Social Determinants of Health   Financial Resource Strain: Not on file  Food Insecurity: Not on file  Transportation Needs: Not on file  Physical Activity: Not on file  Stress: Not on file  Social Connections: Not on file  Intimate Partner Violence: Not on file     Review of Systems: General: negative for chills, fever, night sweats or weight changes.  Cardiovascular: negative for chest pain, dyspnea on exertion, edema, orthopnea, palpitations, paroxysmal nocturnal dyspnea or shortness of breath Dermatological: negative for rash Respiratory: negative for cough or wheezing Urologic: negative for hematuria Abdominal: negative for nausea, vomiting, diarrhea, bright red blood per rectum, melena, or hematemesis Neurologic: negative for visual changes, syncope, or  dizziness All other systems reviewed and are otherwise negative except as noted above.    Blood pressure (!) 145/82, pulse 61, height 6' (1.829 m), weight 232 lb (105.2 kg).  General appearance: alert and no distress Neck: no adenopathy, no carotid bruit, no JVD, supple, symmetrical, trachea midline, and thyroid not enlarged, symmetric, no tenderness/mass/nodules Lungs: clear to auscultation bilaterally Heart: regular rate and rhythm, S1, S2 normal, no murmur, click, rub or gallop Extremities: extremities normal, atraumatic, no cyanosis or edema Pulses: 2+ and symmetric Skin: Skin color, texture, turgor normal. No rashes or lesions Neurologic: Grossly normal  EKG sinus rhythm at 61 without ST or T wave changes.  Personally reviewed this EKG.  ASSESSMENT AND PLAN:   Coronary artery disease History of CAD status post PCI and stenting of the LAD with a Cypher drug-eluting stent December 2005.  He had a 30% dominant RCA stenosis.  I performed catheterization on him 04/02/2018 via the femoral approach revealing patent proximal LAD stent with high-grade mid and distal RCA stenosis.  Placed 2 drug-eluting stents to his RCA.  He has done well since and is completely asymptomatic.  Essential hypertension History of essential hypertension blood pressure measured today at 145/82.  He is not on antihypertensive medications.  Hyperlipidemia History of hyperlipidemia on statin therapy with lipid profile performed 12/03/2021 revealing total cholesterol 147, LDL 77 and HDL of 47.     Runell Gess MD FACP,FACC,FAHA, [facility] 07/19/2022 9:33 AM

## 2022-07-19 NOTE — Assessment & Plan Note (Signed)
History of essential hypertension blood pressure measured today at 145/82.  He is not on antihypertensive medications.

## 2022-07-19 NOTE — Assessment & Plan Note (Signed)
History of hyperlipidemia on statin therapy with lipid profile performed 12/03/2021 revealing total cholesterol 147, LDL 77 and HDL of 47.

## 2023-01-23 ENCOUNTER — Other Ambulatory Visit: Payer: Self-pay

## 2023-01-23 ENCOUNTER — Encounter (HOSPITAL_COMMUNITY): Payer: Self-pay | Admitting: Emergency Medicine

## 2023-01-23 ENCOUNTER — Emergency Department (HOSPITAL_COMMUNITY)
Admission: EM | Admit: 2023-01-23 | Discharge: 2023-01-23 | Disposition: A | Discharge status: Home / Self Care | Payer: No Typology Code available for payment source | Attending: Emergency Medicine | Admitting: Emergency Medicine

## 2023-01-23 ENCOUNTER — Emergency Department (HOSPITAL_COMMUNITY): Payer: No Typology Code available for payment source

## 2023-01-23 ENCOUNTER — Other Ambulatory Visit (HOSPITAL_COMMUNITY): Payer: Self-pay | Admitting: Family Medicine

## 2023-01-23 DIAGNOSIS — Z7982 Long term (current) use of aspirin: Secondary | ICD-10-CM | POA: Insufficient documentation

## 2023-01-23 DIAGNOSIS — I2510 Atherosclerotic heart disease of native coronary artery without angina pectoris: Secondary | ICD-10-CM | POA: Insufficient documentation

## 2023-01-23 DIAGNOSIS — Z79899 Other long term (current) drug therapy: Secondary | ICD-10-CM | POA: Insufficient documentation

## 2023-01-23 DIAGNOSIS — N5089 Other specified disorders of the male genital organs: Secondary | ICD-10-CM

## 2023-01-23 DIAGNOSIS — I10 Essential (primary) hypertension: Secondary | ICD-10-CM | POA: Insufficient documentation

## 2023-01-23 DIAGNOSIS — N50819 Testicular pain, unspecified: Secondary | ICD-10-CM | POA: Diagnosis present

## 2023-01-23 DIAGNOSIS — N5082 Scrotal pain: Secondary | ICD-10-CM

## 2023-01-23 LAB — URINALYSIS, ROUTINE W REFLEX MICROSCOPIC
Bilirubin Urine: NEGATIVE
Glucose, UA: NEGATIVE mg/dL
Hgb urine dipstick: NEGATIVE
Ketones, ur: NEGATIVE mg/dL
Leukocytes,Ua: NEGATIVE
Nitrite: NEGATIVE
Protein, ur: NEGATIVE mg/dL
Specific Gravity, Urine: 1.016 (ref 1.005–1.030)
pH: 6 (ref 5.0–8.0)

## 2023-01-23 MED ORDER — LEVOFLOXACIN 500 MG PO TABS: 500.0000 mg | ORAL_TABLET | Freq: Every day | ORAL | 0 refills | Status: AC

## 2023-01-23 NOTE — ED Triage Notes (Signed)
Patient arrives ambulatory by POV c/o right sided testicle pain and swelling x 2 weeks. Went to [place] at [place] and sent here for [place] study.

## 2023-01-23 NOTE — Discharge Instructions (Addendum)
Ultrasound report: no testicular abnormality or evidence of torsion.   2.1 x 2.0 x 1.1 cm complex cystic area in the right scrotum inferior  to the right testicle concerning for scrotal abscess.   Moderate right hydrocele and small left hydrocele.   Take antibiotics as prescribed.  Follow-up with urology.  Please return if swelling increases or becomes more fluctuant and warm or if you develop a fever as we discussed.  Recommend Tylenol and ibuprofen for pain.

## 2023-01-23 NOTE — ED Notes (Signed)
U/S notified and will be down after the get though upstairs.

## 2023-01-23 NOTE — ED Provider Notes (Signed)
[place] EMERGENCY DEPARTMENT AT [facility] Provider Note   CSN: [id number] Arrival date & time: 01/23/23  1705     History  Chief Complaint  Patient presents with   Testicle Pain    Phillip Mercado is a 62 y.o. male.  Patient here for right testicular pain for the last 2 weeks.  He has noticed a lump in his right testicle area as well.  Denies any discharge or pain with urination.  History of CAD, hypertension, high cholesterol.  Denies any fevers or chills.  Nothing makes it worse or better.  The history is provided by the patient.       Home Medications Prior to Admission medications   Medication Sig Start Date End Date Taking? Authorizing Provider  levofloxacin (LEVAQUIN) 500 MG tablet Take 1 tablet (500 mg total) by mouth daily for 10 days. 01/23/23 02/02/23 Yes Phillip Flater, DO  Ascorbic Acid (VITAMIN C) 100 MG tablet Take 100 mg by mouth daily.    [provider]  aspirin EC 81 MG tablet Take 81 mg by mouth at bedtime.     [provider]  Cholecalciferol (VITAMIN D-3 PO) Take 1 capsule by mouth daily.    [provider]  clopidogrel (PLAVIX) 75 MG tablet Take 1 tablet (75 mg total) by mouth daily. 07/19/22   Phillip Gess, MD  Coenzyme Q10 (CO Q 10) 100 MG CAPS Take 100 mg by mouth daily.     [provider]  Coenzyme Q10-Vitamin E (QUNOL ULTRA COQ10 PO) Take 1,000 mg by mouth 3 (three) times a week. LIQUID    [provider]  ezetimibe (ZETIA) 10 MG tablet Take 1 tablet (10 mg total) by mouth daily. 07/19/22   Phillip Gess, MD  Multiple Vitamin (MULTIVITAMIN WITH MINERALS) TABS tablet Take 1 tablet by mouth daily.    [provider]  nitroGLYCERIN (NITROSTAT) 0.4 MG SL tablet Place 1 tablet (0.4 mg total) under the tongue every 5 (five) minutes as needed for chest pain. 07/19/22 10/17/22  Phillip Gess, MD  simvastatin (ZOCOR) 80 MG tablet Take 1 tablet (80 mg total) by mouth daily at 6 PM.  07/19/22   Phillip Gess, MD      Allergies    Crestor [rosuvastatin calcium] and Vytorin [ezetimibe-simvastatin]    Review of Systems   Review of Systems  Physical Exam Updated Vital Signs BP 132/84 (BP Location: Left Arm)   Pulse 64   Temp 98 F (36.7 C) (Oral)   Resp 16   Ht 6' 0.5" (1.842 m)   Wt 101.6 kg   SpO2 99%   BMI 29.96 kg/m  Physical Exam Vitals and nursing note reviewed. Exam conducted with a chaperone present.  Constitutional:      General: He is not in acute distress.    Appearance: He is well-developed. He is not ill-appearing.  HENT:     Head: Normocephalic and atraumatic.     Nose: Nose normal.     Mouth/Throat:     Mouth: Mucous membranes are moist.  Eyes:     Extraocular Movements: Extraocular movements intact.     Conjunctiva/sclera: Conjunctivae normal.     Pupils: Pupils are equal, round, and reactive to light.  Cardiovascular:     Rate and Rhythm: Normal rate and regular rhythm.     Pulses: Normal pulses.     Heart sounds: Normal heart sounds. No murmur heard. Pulmonary:     Effort: Pulmonary effort is normal.  No respiratory distress.     Breath sounds: Normal breath sounds.  Abdominal:     Palpations: Abdomen is soft.     Tenderness: There is no abdominal tenderness.  Genitourinary:    Comments: Tenderness and swelling to the right lower testicle/epididymis area but there is no major erythema, fluctuance or abscess, equivocal cremasteric reflex, left testicle unremarkable Musculoskeletal:        General: No swelling.     Cervical back: Normal range of motion and neck supple.  Skin:    General: Skin is warm and dry.     Capillary Refill: Capillary refill takes less than 2 seconds.  Neurological:     General: No focal deficit present.     Mental Status: He is alert.  Psychiatric:        Mood and Affect: Mood normal.     ED Results / Procedures / Treatments   Labs (all labs ordered are listed, but only abnormal results are  displayed) Labs Reviewed  URINALYSIS, ROUTINE W REFLEX MICROSCOPIC    EKG None  Radiology US SCROTUM W/DOPPLER  Result Date: 01/23/2023 CLINICAL DATA:  Right testicle pain EXAM: SCROTAL ULTRASOUND DOPPLER ULTRASOUND OF THE TESTICLES TECHNIQUE: Complete ultrasound examination of the testicles, epididymis, and other scrotal structures was performed. Color and spectral Doppler ultrasound were also utilized to evaluate blood flow to the testicles. COMPARISON:  None Available. FINDINGS: Right testicle Measurements: 3.5 x 2.7 x 2.7 cm. No mass or microlithiasis visualized. Left testicle Measurements: 3.5 x 2.5 x 2.7 cm. No mass or microlithiasis visualized. Right epididymis:  Normal in size and appearance. Left epididymis:  Normal in size and appearance. Hydrocele:  Small left and moderate right hydrocele. Varicocele:  None visualized. Pulsed Doppler interrogation of both testes demonstrates normal low resistance arterial and venous waveforms bilaterally. Other: Complex cystic area noted inferior to the right testicle adjacent to the right epididymal tail measuring 2.1 x 2.0 x 1.1 cm. Hyperemia in the surrounding tissues. Findings concerning for scrotal abscess. IMPRESSION: No testicular abnormality or evidence of torsion. 2.1 x 2.0 x 1.1 cm complex cystic area in the right scrotum inferior to the right testicle concerning for scrotal abscess. Moderate right hydrocele and small left hydrocele. Electronically Signed   By: Phillip Nose M.D.   On: 01/23/2023 21:32    Procedures Procedures    Medications Ordered in ED Medications - No data to display  ED Course/ Medical Decision Making/ A&P                                 Medical Decision Making Amount and/or Complexity of Data Reviewed Labs: ordered.   Phillip Mercado is here with right testicular swelling.  Ongoing for about 2 weeks.  Firm mass palpated to the right lower testicle area but no major fluctuance or redness or swelling.  States that  it has slightly decreased in size over that time but he went to his primary care doctor who sent him for ultrasound.  He has had a vasectomy in the past.  He is in monogamous relationship.  Denies any discharge.  No concern for STDs.  Urinalysis negative for infection.  I reviewed interpreted urinalysis.  He denies any fevers or chills.  He has no significant medical history other than CAD.  He has never had issues with his testicles in the past.  Overall ultrasound was performed to evaluate for epididymitis or orchitis or mass or torsion.  Ultrasound showed no evidence of torsion per radiology report.  He did have a moderate right hydrocele and small left hydrocele.  He had a 2 x 2 x 1 complex cystic area in the right scrotum inferior to the right testicle.  Radiology noted may be some concern for scrotal abscess.  Clinically this seems less likely given history and physical.  I talked with Dr. Annabell Howells with urology who recommends close outpatient follow-up.  Will likely just need surgical removal but can conservatively start some antibiotics to treat for possible infectious process.  Patient overall understands to return if he develops larger swelling, fluctuance, redness and warmth and fever or other concerning symptoms.  At this time I do not think we need to perform an I&D or any other emergent process.  Knowledge he agrees as well.  Will start him on Levaquin and have him follow-up outpatient.  He understands return precautions.  Discharged in good condition.  This chart was dictated using voice recognition software.  Despite best efforts to proofread,  errors can occur which can change the documentation meaning.         Final Clinical Impression(s) / ED Diagnoses Final diagnoses:  Testicular mass    Rx / DC Orders ED Discharge Orders          Ordered    levofloxacin (LEVAQUIN) 500 MG tablet  Daily        01/23/23 2203              Virgina Norfolk, DO 01/23/23 2203

## 2023-02-11 ENCOUNTER — Other Ambulatory Visit: Payer: Self-pay | Admitting: Cardiovascular Disease

## 2023-07-23 ENCOUNTER — Other Ambulatory Visit: Payer: Self-pay | Admitting: Cardiovascular Disease

## 2023-08-01 ENCOUNTER — Telehealth: Payer: Self-pay | Admitting: Cardiovascular Disease

## 2023-08-01 MED ORDER — SIMVASTATIN 80 MG PO TABS: 80.0000 mg | ORAL_TABLET | Freq: Every day | ORAL | 0 refills | Status: DC

## 2023-08-01 NOTE — Telephone Encounter (Signed)
 Pt's medication was sent to pt's pharmacy as requested. Confirmation received.

## 2023-08-01 NOTE — Telephone Encounter (Signed)
*  STAT* If patient is at the pharmacy, call can be transferred to refill team.   1. Which medications need to be refilled? (please list name of each medication and dose if known)   simvastatin  (ZOCOR ) 80 MG tablet    2. Which pharmacy/location (including street and city if local pharmacy) is medication to be sent to? [id number] - [place], [place] - [address] AT [facility] OF [address]   3. Do they need a 30 day or 90 day supply? 90

## 2023-08-10 ENCOUNTER — Other Ambulatory Visit: Payer: Self-pay | Admitting: Cardiovascular Disease

## 2023-08-30 ENCOUNTER — Other Ambulatory Visit: Payer: Self-pay | Admitting: Cardiovascular Disease

## 2023-09-05 ENCOUNTER — Telehealth: Payer: Self-pay

## 2023-09-05 DIAGNOSIS — E782 Mixed hyperlipidemia: Secondary | ICD-10-CM

## 2023-09-05 DIAGNOSIS — I2510 Atherosclerotic heart disease of native coronary artery without angina pectoris: Secondary | ICD-10-CM

## 2023-09-05 DIAGNOSIS — I10 Essential (primary) hypertension: Secondary | ICD-10-CM

## 2023-09-05 LAB — CBC

## 2023-09-05 NOTE — Telephone Encounter (Signed)
 Pt coming in for 1 year follow up with Dr. Court. Lab called requesting labs due to pt being at the lab for draw of samples. Per verbal from Dr. Court, order placed for CBC, CMP, and lipid panel. Lab advised. Pt is fasting.

## 2023-09-06 ENCOUNTER — Ambulatory Visit: Payer: Self-pay | Admitting: Cardiovascular Disease

## 2023-09-06 LAB — COMPREHENSIVE METABOLIC PANEL WITH GFR
ALT: 28 IU/L (ref 0–44)
AST: 32 IU/L (ref 0–40)
Albumin: 4.5 g/dL (ref 3.9–4.9)
Alkaline Phosphatase: 114 IU/L (ref 44–121)
BUN/Creatinine Ratio: 17 (ref 10–24)
BUN: 23 mg/dL (ref 8–27)
Bilirubin Total: 0.4 mg/dL (ref 0.0–1.2)
CO2: 19 mmol/L — ABNORMAL LOW (ref 20–29)
Calcium: 9.6 mg/dL (ref 8.6–10.2)
Chloride: 105 mmol/L (ref 96–106)
Creatinine, Ser: 1.34 mg/dL — ABNORMAL HIGH (ref 0.76–1.27)
Globulin, Total: 2.7 g/dL (ref 1.5–4.5)
Glucose: 93 mg/dL (ref 70–99)
Potassium: 4.6 mmol/L (ref 3.5–5.2)
Sodium: 138 mmol/L (ref 134–144)
Total Protein: 7.2 g/dL (ref 6.0–8.5)
eGFR: 60 mL/min/{1.73_m2} (ref >59)

## 2023-09-06 LAB — LIPID PANEL
Chol/HDL Ratio: 3.7 ratio (ref 0.0–5.0)
Cholesterol, Total: 153 mg/dL (ref 100–199)
HDL: 41 mg/dL (ref >39)
LDL Chol Calc (NIH): 81 mg/dL (ref 0–99)
Triglycerides: 180 mg/dL — ABNORMAL HIGH (ref 0–149)
VLDL Cholesterol Cal: 31 mg/dL (ref 5–40)

## 2023-09-06 LAB — CBC
Hematocrit: 45.1 % (ref 37.5–51.0)
Hemoglobin: 14.3 g/dL (ref 13.0–17.7)
MCH: 28.9 pg (ref 26.6–33.0)
MCHC: 31.7 g/dL (ref 31.5–35.7)
MCV: 91 fL (ref 79–97)
Platelets: 225 10*3/uL (ref 150–450)
RBC: 4.95 x10E6/uL (ref 4.14–5.80)
RDW: 13.2 % (ref 11.6–15.4)
WBC: 4.4 x10E3/uL (ref 3.4–10.8)

## 2023-09-11 ENCOUNTER — Encounter: Payer: Self-pay | Admitting: Cardiovascular Disease

## 2023-09-11 ENCOUNTER — Ambulatory Visit: Attending: Cardiovascular Disease | Admitting: Cardiovascular Disease

## 2023-09-11 VITALS — BP 118/80 | HR 61 | Ht 72.0 in | Wt 229.0 lb

## 2023-09-11 DIAGNOSIS — I2510 Atherosclerotic heart disease of native coronary artery without angina pectoris: Secondary | ICD-10-CM

## 2023-09-11 DIAGNOSIS — E782 Mixed hyperlipidemia: Secondary | ICD-10-CM

## 2023-09-11 DIAGNOSIS — I10 Essential (primary) hypertension: Secondary | ICD-10-CM | POA: Diagnosis not present

## 2023-09-11 NOTE — Progress Notes (Signed)
 09/11/2023 Phillip Mercado   1960/10/29  [id number]  Primary Physician Kip Righter, MD Primary Cardiologist: Dorn JINNY Lesches MD GENI CODY [place], [place]  HPI:  Phillip Mercado is a 63 y.o.    mild to moderately overweight married Caucasian male father of 5 children, grandfather of 7 grandchildren who I last saw in the office 07/19/2022.SABRA  He works as a Oncologist.  One of his sons just finished medical school and got his DO degree.  He started his residency in [facility] [place] .  He has a history of CAD status post LAD PCI and stenting with a Cypher drug-eluting stent of December 2005. He had 30% dominant RCA stenosis and normal in function. His other problems include hypertension and hyperlipidemia.   Because of new onset chest discomfort and fatigue while walking his dog I did get a stress test on him 03/14/2018 which was abnormal.  This led to a heart cath 04/02/2018 with right femoral approach revealing a patent proximal LAD stent placed in 2005, high-grade mid and distal dominant RCA stenoses, and normal LV function.  I placed 2 drug-eluting stents in his RCA.  He was discharged home the following day.  He has had no recurrent symptoms other than what sounds like left upper extremity neuropathic pain.     Since I saw him in the office a year ago he continues to do well.  He walks his dog twice a day without symptoms of chest pain or shortness of breath.  He continues to coach girls soccer.   Current Meds  Medication Sig   Ascorbic Acid (VITAMIN C) 100 MG tablet Take 100 mg by mouth daily.   aspirin  EC 81 MG tablet Take 81 mg by mouth at bedtime.    Cholecalciferol (VITAMIN D-3 PO) Take 1 capsule by mouth daily.   clopidogrel  (PLAVIX ) 75 MG tablet TAKE 1 TABLET BY MOUTH EVERY DAY   Coenzyme Q10 (CO Q 10) 100 MG CAPS Take 100 mg by mouth daily.    Coenzyme Q10-Vitamin E (QUNOL ULTRA COQ10 PO) Take 1,000 mg by mouth 3 (three) times a week.  LIQUID   ezetimibe  (ZETIA ) 10 MG tablet TAKE 1 TABLET(10 MG) BY MOUTH DAILY   Multiple Vitamin (MULTIVITAMIN WITH MINERALS) TABS tablet Take 1 tablet by mouth daily.   nitroGLYCERIN  (NITROSTAT ) 0.4 MG SL tablet Place 1 tablet (0.4 mg total) under the tongue every 5 (five) minutes as needed for chest pain.   simvastatin  (ZOCOR ) 80 MG tablet TAKE 1 TABLET(80 MG) BY MOUTH DAILY AT 6 PM     Allergies  Allergen Reactions   Crestor [Rosuvastatin Calcium]     Muscle pains   Vytorin [Ezetimibe -Simvastatin ] Other (See Comments)    Increased liver enzymes    Social History   Socioeconomic History   Marital status: Married    Spouse name: Not on file   Number of children: Not on file   Years of education: Not on file   Highest education level: Not on file  Occupational History   Not on file  Tobacco Use   Smoking status: Never   Smokeless tobacco: Never  Vaping Use   Vaping status: Never Used  Substance and Sexual Activity   Alcohol use: No   Drug use: No   Sexual activity: Not on file  Other Topics Concern   Not on file  Social History Narrative   Not on file   Social Drivers of Health   Financial  Resource Strain: Not on file  Food Insecurity: Not on file  Transportation Needs: Not on file  Physical Activity: Not on file  Stress: Not on file  Social Connections: Not on file  Intimate Partner Violence: Not on file     Review of Systems: General: negative for chills, fever, night sweats or weight changes.  Cardiovascular: negative for chest pain, dyspnea on exertion, edema, orthopnea, palpitations, paroxysmal nocturnal dyspnea or shortness of breath Dermatological: negative for rash Respiratory: negative for cough or wheezing Urologic: negative for hematuria Abdominal: negative for nausea, vomiting, diarrhea, bright red blood per rectum, melena, or hematemesis Neurologic: negative for visual changes, syncope, or dizziness All other systems reviewed and are otherwise  negative except as noted above.    Blood pressure 118/80, pulse 61, height 6' (1.829 m), weight 229 lb (103.9 kg), SpO2 98%.  General appearance: alert and no distress Neck: no adenopathy, no carotid bruit, no JVD, supple, symmetrical, trachea midline, and thyroid not enlarged, symmetric, no tenderness/mass/nodules Lungs: clear to auscultation bilaterally Heart: regular rate and rhythm, S1, S2 normal, no murmur, click, rub or gallop Extremities: extremities normal, atraumatic, no cyanosis or edema Pulses: 2+ and symmetric Skin: Skin color, texture, turgor normal. No rashes or lesions Neurologic: Grossly normal  EKG EKG Interpretation Date/Time:  Monday September 11 2023 08:01:28 EDT Ventricular Rate:  61 PR Interval:  134 QRS Duration:  78 QT Interval:  388 QTC Calculation: 390 R Axis:   53  Text Interpretation: Normal sinus rhythm Normal ECG When compared with ECG of 03-Apr-2018 07:18, No significant change was found Confirmed by Court Carrier [phone]) on 09/11/2023 8:06:09 AM    ASSESSMENT AND PLAN:   Coronary artery disease History of CAD status post LAD PCI and stenting with a Cypher drug-eluting stent by myself December 2005.  He did have a 30% dominant RCA stenosis with normal LV function.  Because of new onset chest discomfort and abnormal stress test performed 03/14/2018 I performed repeat catheterization 04/02/2018 for the right femoral approach revealing a patent proximal LAD stent and high-grade mid and distal dominant RCA stenosis with normal LV function.  Placed 2 drug-eluting stents in the RCA.  Has been asymptomatic since, and denies chest pain or shortness of breath.  Essential hypertension History of essential hypertension with blood pressure measured today at 118/80.  He is not on antihypertensive medications.  Hyperlipidemia History of hyperlipidemia on high-dose simvastatin  and Zetia  with lipid profile performed 09/05/2023 revealing total cholesterol 153, LDL 81 and HDL  41.  She is not at goal for secondary prevention.  The patient has said that he will make lifestyle modifications.     Carrier DOROTHA Court MD FACP,FACC,FAHA, [facility] 09/11/2023 8:15 AM

## 2023-09-11 NOTE — Assessment & Plan Note (Signed)
 History of CAD status post LAD PCI and stenting with a Cypher drug-eluting stent by myself December 2005.  He did have a 30% dominant RCA stenosis with normal LV function.  Because of new onset chest discomfort and abnormal stress test performed 03/14/2018 I performed repeat catheterization 04/02/2018 for the right femoral approach revealing a patent proximal LAD stent and high-grade mid and distal dominant RCA stenosis with normal LV function.  Placed 2 drug-eluting stents in the RCA.  Has been asymptomatic since, and denies chest pain or shortness of breath.

## 2023-09-11 NOTE — Patient Instructions (Signed)

## 2023-09-11 NOTE — Assessment & Plan Note (Signed)
 History of hyperlipidemia on high-dose simvastatin  and Zetia  with lipid profile performed 09/05/2023 revealing total cholesterol 153, LDL 81 and HDL 41.  She is not at goal for secondary prevention.  The patient has said that he will make lifestyle modifications.

## 2023-09-11 NOTE — Assessment & Plan Note (Signed)
 History of essential hypertension with blood pressure measured today at 118/80.  He is not on antihypertensive medications.

## 2023-12-01 ENCOUNTER — Other Ambulatory Visit: Payer: Self-pay | Admitting: Cardiovascular Disease

## 2023-12-31 ENCOUNTER — Other Ambulatory Visit: Payer: Self-pay | Admitting: Cardiovascular Disease

## 2024-01-01 ENCOUNTER — Telehealth: Payer: Self-pay | Admitting: Cardiology

## 2024-01-01 NOTE — Telephone Encounter (Signed)
*  STAT* If patient is at the pharmacy, call can be transferred to refill team.   1. Which medications need to be refilled? (please list name of each medication and dose if known) simvastatin  (ZOCOR ) 80 MG tablet    2. Would you like to learn more about the convenience, safety, & potential cost savings by using the [facility]?    3. Are you open to using the [facility] (Type [facility]. ).   4. Which pharmacy/location (including street and city if local pharmacy) is medication to be sent to? [id number] - [place], [place] - [address] AT [facility] OF [address]    5. Do they need a 30 day or 90 day supply? 30 day

## 2024-01-01 NOTE — Telephone Encounter (Signed)
 Call placed to pt.  He has been made aware that his refill for Simvastatin  was sent in to [facility], 12/01/23.  Per pt, he will call to make sure it is ready before he heads that way to pick it up.

## 2024-02-11 ENCOUNTER — Other Ambulatory Visit: Payer: Self-pay | Admitting: Cardiovascular Disease
